# Patient Record
Sex: Female | Born: 1970 | Race: Black or African American | Hispanic: No | Marital: Single | Smoking: Never smoker
Health system: Southern US, Community
[De-identification: ages and names within clinical notes are randomized; demographics above are authoritative.]

## PROBLEM LIST (undated history)

## (undated) DIAGNOSIS — D649 Anemia, unspecified: Secondary | ICD-10-CM

## (undated) DIAGNOSIS — Z87898 Personal history of other specified conditions: Secondary | ICD-10-CM

## (undated) DIAGNOSIS — I10 Essential (primary) hypertension: Secondary | ICD-10-CM

## (undated) DIAGNOSIS — J45909 Unspecified asthma, uncomplicated: Secondary | ICD-10-CM

## (undated) DIAGNOSIS — M5126 Other intervertebral disc displacement, lumbar region: Secondary | ICD-10-CM

## (undated) DIAGNOSIS — E611 Iron deficiency: Secondary | ICD-10-CM

## (undated) DIAGNOSIS — J302 Other seasonal allergic rhinitis: Secondary | ICD-10-CM

## (undated) DIAGNOSIS — S0300XA Dislocation of jaw, unspecified side, initial encounter: Secondary | ICD-10-CM

## (undated) DIAGNOSIS — Z973 Presence of spectacles and contact lenses: Secondary | ICD-10-CM

## (undated) DIAGNOSIS — B373 Candidiasis of vulva and vagina: Secondary | ICD-10-CM

## (undated) DIAGNOSIS — D259 Leiomyoma of uterus, unspecified: Secondary | ICD-10-CM

## (undated) DIAGNOSIS — N201 Calculus of ureter: Secondary | ICD-10-CM

## (undated) DIAGNOSIS — I059 Rheumatic mitral valve disease, unspecified: Secondary | ICD-10-CM

## (undated) DIAGNOSIS — D1720 Benign lipomatous neoplasm of skin and subcutaneous tissue of unspecified limb: Secondary | ICD-10-CM

## (undated) DIAGNOSIS — Z862 Personal history of diseases of the blood and blood-forming organs and certain disorders involving the immune mechanism: Secondary | ICD-10-CM

## (undated) DIAGNOSIS — N6019 Diffuse cystic mastopathy of unspecified breast: Secondary | ICD-10-CM

## (undated) DIAGNOSIS — M5430 Sciatica, unspecified side: Secondary | ICD-10-CM

## (undated) DIAGNOSIS — N200 Calculus of kidney: Secondary | ICD-10-CM

## (undated) HISTORY — PX: WISDOM TOOTH EXTRACTION: SHX21

## (undated) HISTORY — DX: Benign lipomatous neoplasm of skin and subcutaneous tissue of unspecified limb: D17.20

## (undated) HISTORY — DX: Dislocation of jaw, unspecified side, initial encounter: S03.00XA

## (undated) HISTORY — DX: Candidiasis of vulva and vagina: B37.3

## (undated) HISTORY — DX: Iron deficiency: E61.1

## (undated) HISTORY — DX: Sciatica, unspecified side: M54.30

## (undated) HISTORY — DX: Anemia, unspecified: D64.9

## (undated) HISTORY — DX: Diffuse cystic mastopathy of unspecified breast: N60.19

## (undated) HISTORY — DX: Other seasonal allergic rhinitis: J30.2

---

## 2001-08-15 ENCOUNTER — Emergency Department (HOSPITAL_COMMUNITY): Admission: EM | Admit: 2001-08-15 | Discharge: 2001-08-16 | Payer: Self-pay | Admitting: Emergency Medicine

## 2001-08-16 ENCOUNTER — Encounter: Payer: Self-pay | Admitting: Emergency Medicine

## 2006-11-01 ENCOUNTER — Other Ambulatory Visit: Admission: RE | Admit: 2006-11-01 | Discharge: 2006-11-01 | Payer: Self-pay | Admitting: Obstetrics and Gynecology

## 2007-04-14 ENCOUNTER — Ambulatory Visit (HOSPITAL_COMMUNITY)
Admission: RE | Admit: 2007-04-14 | Discharge: 2007-04-14 | Payer: Self-pay | Admitting: Physical Medicine and Rehabilitation

## 2007-10-17 ENCOUNTER — Encounter: Admission: RE | Admit: 2007-10-17 | Discharge: 2007-10-17 | Payer: Self-pay | Admitting: Internal Medicine

## 2009-02-19 ENCOUNTER — Encounter: Admission: RE | Admit: 2009-02-19 | Discharge: 2009-02-19 | Payer: Self-pay | Admitting: Internal Medicine

## 2009-03-27 ENCOUNTER — Other Ambulatory Visit: Admission: RE | Admit: 2009-03-27 | Discharge: 2009-03-27 | Payer: Self-pay | Admitting: Obstetrics and Gynecology

## 2009-10-05 HISTORY — PX: MYOMECTOMY: SHX85

## 2009-10-05 HISTORY — PX: LAPAROSCOPIC GELPORT ASSISTED MYOMECTOMY: SHX6549

## 2010-04-10 ENCOUNTER — Other Ambulatory Visit: Admission: RE | Admit: 2010-04-10 | Discharge: 2010-04-10 | Payer: Self-pay | Admitting: Obstetrics and Gynecology

## 2013-03-01 ENCOUNTER — Other Ambulatory Visit: Payer: Self-pay | Admitting: Internal Medicine

## 2013-03-01 DIAGNOSIS — Z1231 Encounter for screening mammogram for malignant neoplasm of breast: Secondary | ICD-10-CM

## 2013-03-22 ENCOUNTER — Other Ambulatory Visit (HOSPITAL_COMMUNITY)
Admission: RE | Admit: 2013-03-22 | Discharge: 2013-03-22 | Disposition: A | Discharge status: Home / Self Care | Payer: BC Managed Care – PPO | Source: Ambulatory Visit | Attending: Obstetrics and Gynecology | Admitting: Obstetrics and Gynecology

## 2013-03-22 ENCOUNTER — Other Ambulatory Visit: Payer: Self-pay | Admitting: Obstetrics and Gynecology

## 2013-03-22 DIAGNOSIS — Z1151 Encounter for screening for human papillomavirus (HPV): Secondary | ICD-10-CM | POA: Insufficient documentation

## 2013-03-22 DIAGNOSIS — Z01419 Encounter for gynecological examination (general) (routine) without abnormal findings: Secondary | ICD-10-CM | POA: Insufficient documentation

## 2013-03-30 ENCOUNTER — Ambulatory Visit
Admission: RE | Admit: 2013-03-30 | Discharge: 2013-03-30 | Disposition: A | Discharge status: Home / Self Care | Payer: BC Managed Care – PPO | Source: Ambulatory Visit | Attending: Internal Medicine | Admitting: Internal Medicine

## 2013-03-30 DIAGNOSIS — Z1231 Encounter for screening mammogram for malignant neoplasm of breast: Secondary | ICD-10-CM

## 2013-05-05 ENCOUNTER — Encounter (INDEPENDENT_AMBULATORY_CARE_PROVIDER_SITE_OTHER): Payer: Self-pay

## 2013-05-09 ENCOUNTER — Ambulatory Visit (INDEPENDENT_AMBULATORY_CARE_PROVIDER_SITE_OTHER): Payer: BC Managed Care – PPO | Admitting: General Surgery

## 2013-05-09 ENCOUNTER — Encounter (INDEPENDENT_AMBULATORY_CARE_PROVIDER_SITE_OTHER): Payer: Self-pay | Admitting: General Surgery

## 2013-05-09 DIAGNOSIS — D1720 Benign lipomatous neoplasm of skin and subcutaneous tissue of unspecified limb: Secondary | ICD-10-CM

## 2013-05-09 DIAGNOSIS — D1739 Benign lipomatous neoplasm of skin and subcutaneous tissue of other sites: Secondary | ICD-10-CM

## 2013-05-09 NOTE — Patient Instructions (Addendum)
Please call when you would like to schedule your surgery, [phone]

## 2013-05-09 NOTE — Progress Notes (Signed)
Patient ID: Jasmine Monroe, female   DOB: 02-01-1971, 42 y.o.   MRN: [id number]  No chief complaint on file.   HPI Jasmine Monroe is a 42 y.o. female.   HPI  She is referred by Dr. Gerald Leitz for further evaluation of an enlarging left axillary soft tissue mass. She has had this for some time but its becoming larger.  She denies any pain from this area.  Past Medical History  Diagnosis Date  . Allergic rhinitis, seasonal   . TMJ (dislocation of temporomandibular joint)   . Fibrocystic breast   . Sciatica   . Anemia   . Iron deficiency   . Vaginal yeast infection   . Lipoma of axilla     left    Past Surgical History  Procedure Laterality Date  . Myomectomy  2011    uterine fibroid  . Wisdom tooth extraction      Family History  Problem Relation Age of Onset  . Hyperlipidemia Mother   . Cancer Father     prostate cancer  . Hypertension Sister     Social History History  Substance Use Topics  . Smoking status: Not on file  . Smokeless tobacco: Not on file  . Alcohol Use: Not on file    Not on File  Current Outpatient Prescriptions  Medication Sig Dispense Refill  . calcium-vitamin D (CALCIUM 500+D HIGH POTENCY) 500-400 MG-UNIT per tablet Take 1 tablet by mouth daily.      . cetirizine (ZYRTEC) 10 MG chewable tablet Chew 10 mg by mouth daily.      . fluticasone (FLONASE) 50 MCG/ACT nasal spray Place 2 sprays into the nose daily.      Marland Kitchen ibuprofen (ADVIL,MOTRIN) 800 MG tablet Take 800 mg by mouth every 8 (eight) hours as needed for pain.      . Magnesium 250 MG TABS Take 250 mg by mouth daily.       No current facility-administered medications for this visit.    Review of Systems Review of Systems  Constitutional: Negative.   Respiratory: Negative.   Cardiovascular: Negative.     There were no vitals taken for this visit.  Physical Exam Physical Exam  Constitutional: She appears well-developed and well-nourished. No distress.  Musculoskeletal:  6 cm soft  tissue mass lateral left axilla that is mobile and nontender. No erythema. Nothing like this on the right side.    Data Reviewed Dr. Dawayne Patricia note.  Assessment    Enlarging subcutaneous soft tissue mass left axilla-most likely lipoma but other undefined neoplasm cannot be ruled out.     Plan    Excision of left axillary soft tissue mass under general anesthesia.  The procedure, risks, and rationale were discussed with her. Risks include but not limited to bleeding, infection, wound healing problems, anesthesia, nerve damage and weakness. We also talked about potential for cosmetic deformity. She seems to understand all this and would like to proceed later early next year. She will call back to schedule the operation.       Alphonza Tramell J 05/09/2013, 1:54 PM

## 2013-11-01 ENCOUNTER — Ambulatory Visit (INDEPENDENT_AMBULATORY_CARE_PROVIDER_SITE_OTHER): Payer: BC Managed Care – PPO | Admitting: General Surgery

## 2013-11-01 ENCOUNTER — Encounter (INDEPENDENT_AMBULATORY_CARE_PROVIDER_SITE_OTHER): Payer: Self-pay | Admitting: General Surgery

## 2013-11-01 ENCOUNTER — Encounter (INDEPENDENT_AMBULATORY_CARE_PROVIDER_SITE_OTHER): Payer: Self-pay

## 2013-11-01 VITALS — BP 112/80 | HR 80 | Temp 98.7°F | Resp 14 | Ht 62.0 in | Wt 126.8 lb

## 2013-11-01 DIAGNOSIS — D1739 Benign lipomatous neoplasm of skin and subcutaneous tissue of other sites: Secondary | ICD-10-CM

## 2013-11-01 NOTE — Progress Notes (Signed)
Subjective:     Patient ID: Jasmine Monroe, female   DOB: Nov 27, 1970, 43 y.o.   MRN: [id number]  HPI  She is here to schedule surgery. I saw her in August of 2014. She had an enlarging left axillary mass and was interested in having that removed after the holidays. She is not sure that it has gotten any larger.   Review of Systems     Objective:   Physical Exam Gen.-she looks well and is in no acute distress.  Musculoskeletal-9 cm mass left posterior axillary line    Assessment:     Enlarging left axillary mass     Plan:     Removal of left axillary mass. We discussed the procedure risks and aftercare again. She seems understand all this and would like to proceed.

## 2013-11-01 NOTE — Patient Instructions (Signed)
We will schedule a surgery date today.

## 2013-12-03 HISTORY — PX: LIPOMA EXCISION: SHX5283

## 2013-12-07 ENCOUNTER — Other Ambulatory Visit (INDEPENDENT_AMBULATORY_CARE_PROVIDER_SITE_OTHER): Payer: Self-pay | Admitting: *Deleted

## 2013-12-07 ENCOUNTER — Other Ambulatory Visit (INDEPENDENT_AMBULATORY_CARE_PROVIDER_SITE_OTHER): Payer: Self-pay | Admitting: General Surgery

## 2013-12-07 DIAGNOSIS — D1739 Benign lipomatous neoplasm of skin and subcutaneous tissue of other sites: Secondary | ICD-10-CM

## 2013-12-07 MED ORDER — HYDROCODONE-ACETAMINOPHEN 5-325 MG PO TABS: 1.0000 | ORAL_TABLET | ORAL | Status: DC | PRN

## 2013-12-12 ENCOUNTER — Telehealth (INDEPENDENT_AMBULATORY_CARE_PROVIDER_SITE_OTHER): Payer: Self-pay

## 2013-12-12 NOTE — Telephone Encounter (Signed)
Notified pt her pathology shows a benign lipoma.

## 2013-12-25 ENCOUNTER — Ambulatory Visit (INDEPENDENT_AMBULATORY_CARE_PROVIDER_SITE_OTHER): Payer: BC Managed Care – PPO | Admitting: General Surgery

## 2013-12-25 ENCOUNTER — Encounter (INDEPENDENT_AMBULATORY_CARE_PROVIDER_SITE_OTHER): Payer: Self-pay | Admitting: General Surgery

## 2013-12-25 VITALS — BP 110/80 | HR 64 | Resp 14 | Ht 62.5 in | Wt 127.6 lb

## 2013-12-25 DIAGNOSIS — Z4889 Encounter for other specified surgical aftercare: Secondary | ICD-10-CM

## 2013-12-25 NOTE — Progress Notes (Signed)
She is here for a postoperative visit after removal of a soft tissue mass in the left axilla 12/07/2013. She is doing well and has no complaints. Pathology is consistent with a benign lipoma  On exam, the left axillary incision is clean and intact with minimal swelling.  Assessment: Wound is healing well and pathology is benign.  Plan: Activities as tolerated. Followup when necessary.

## 2013-12-25 NOTE — Patient Instructions (Signed)
Activities as tolerated with left arm. Call if you have problems with the incision.

## 2013-12-26 ENCOUNTER — Encounter (INDEPENDENT_AMBULATORY_CARE_PROVIDER_SITE_OTHER): Payer: Self-pay | Admitting: General Surgery

## 2014-03-23 ENCOUNTER — Other Ambulatory Visit: Payer: Self-pay

## 2014-03-23 DIAGNOSIS — Z1231 Encounter for screening mammogram for malignant neoplasm of breast: Secondary | ICD-10-CM

## 2014-03-28 ENCOUNTER — Other Ambulatory Visit: Payer: Self-pay | Admitting: Obstetrics and Gynecology

## 2014-03-28 ENCOUNTER — Other Ambulatory Visit (HOSPITAL_COMMUNITY)
Admission: RE | Admit: 2014-03-28 | Discharge: 2014-03-28 | Disposition: A | Discharge status: Home / Self Care | Payer: BC Managed Care – PPO | Source: Ambulatory Visit | Attending: Obstetrics and Gynecology | Admitting: Obstetrics and Gynecology

## 2014-03-28 DIAGNOSIS — Z01419 Encounter for gynecological examination (general) (routine) without abnormal findings: Secondary | ICD-10-CM | POA: Insufficient documentation

## 2014-03-30 LAB — CYTOLOGY - PAP

## 2014-04-02 ENCOUNTER — Ambulatory Visit
Admission: RE | Admit: 2014-04-02 | Discharge: 2014-04-02 | Disposition: A | Discharge status: Home / Self Care | Payer: BC Managed Care – PPO | Source: Ambulatory Visit

## 2014-04-02 DIAGNOSIS — Z1231 Encounter for screening mammogram for malignant neoplasm of breast: Secondary | ICD-10-CM

## 2015-03-29 ENCOUNTER — Other Ambulatory Visit: Payer: Self-pay

## 2015-03-29 DIAGNOSIS — Z1231 Encounter for screening mammogram for malignant neoplasm of breast: Secondary | ICD-10-CM

## 2015-04-22 ENCOUNTER — Ambulatory Visit: Payer: BC Managed Care – PPO

## 2015-04-22 ENCOUNTER — Ambulatory Visit
Admission: RE | Admit: 2015-04-22 | Discharge: 2015-04-22 | Disposition: A | Discharge status: Home / Self Care | Payer: BC Managed Care – PPO | Source: Ambulatory Visit

## 2015-04-22 DIAGNOSIS — Z1231 Encounter for screening mammogram for malignant neoplasm of breast: Secondary | ICD-10-CM

## 2016-04-10 ENCOUNTER — Other Ambulatory Visit: Payer: Self-pay | Admitting: Internal Medicine

## 2016-04-10 DIAGNOSIS — Z1231 Encounter for screening mammogram for malignant neoplasm of breast: Secondary | ICD-10-CM

## 2016-04-22 ENCOUNTER — Other Ambulatory Visit (HOSPITAL_COMMUNITY)
Admission: RE | Admit: 2016-04-22 | Discharge: 2016-04-22 | Disposition: A | Discharge status: Home / Self Care | Payer: BC Managed Care – PPO | Source: Ambulatory Visit | Attending: Obstetrics and Gynecology | Admitting: Obstetrics and Gynecology

## 2016-04-22 ENCOUNTER — Other Ambulatory Visit: Payer: Self-pay | Admitting: Obstetrics and Gynecology

## 2016-04-22 DIAGNOSIS — Z1151 Encounter for screening for human papillomavirus (HPV): Secondary | ICD-10-CM | POA: Insufficient documentation

## 2016-04-22 DIAGNOSIS — Z01419 Encounter for gynecological examination (general) (routine) without abnormal findings: Secondary | ICD-10-CM | POA: Insufficient documentation

## 2016-04-23 LAB — CYTOLOGY - PAP

## 2016-04-27 ENCOUNTER — Ambulatory Visit
Admission: RE | Admit: 2016-04-27 | Discharge: 2016-04-27 | Disposition: A | Discharge status: Home / Self Care | Payer: BC Managed Care – PPO | Source: Ambulatory Visit | Attending: Internal Medicine | Admitting: Internal Medicine

## 2016-04-27 DIAGNOSIS — Z1231 Encounter for screening mammogram for malignant neoplasm of breast: Secondary | ICD-10-CM

## 2016-11-28 ENCOUNTER — Encounter (HOSPITAL_BASED_OUTPATIENT_CLINIC_OR_DEPARTMENT_OTHER): Payer: Self-pay | Admitting: Respiratory Therapy

## 2016-11-28 ENCOUNTER — Emergency Department (HOSPITAL_BASED_OUTPATIENT_CLINIC_OR_DEPARTMENT_OTHER)
Admission: EM | Admit: 2016-11-28 | Discharge: 2016-11-29 | Disposition: A | Discharge status: Home / Self Care | Payer: BC Managed Care – PPO | Attending: Emergency Medicine | Admitting: Emergency Medicine

## 2016-11-28 DIAGNOSIS — Z79899 Other long term (current) drug therapy: Secondary | ICD-10-CM | POA: Insufficient documentation

## 2016-11-28 DIAGNOSIS — I493 Ventricular premature depolarization: Secondary | ICD-10-CM | POA: Insufficient documentation

## 2016-11-28 DIAGNOSIS — R002 Palpitations: Secondary | ICD-10-CM | POA: Diagnosis present

## 2016-11-28 DIAGNOSIS — R55 Syncope and collapse: Secondary | ICD-10-CM | POA: Diagnosis not present

## 2016-11-28 HISTORY — DX: Unspecified asthma, uncomplicated: J45.909

## 2016-11-28 HISTORY — DX: Rheumatic mitral valve disease, unspecified: I05.9

## 2016-11-28 LAB — URINALYSIS, ROUTINE W REFLEX MICROSCOPIC
Bilirubin Urine: NEGATIVE
Glucose, UA: NEGATIVE mg/dL
Hgb urine dipstick: NEGATIVE
Ketones, ur: NEGATIVE mg/dL
Nitrite: NEGATIVE
Protein, ur: NEGATIVE mg/dL
Specific Gravity, Urine: 1.021 (ref 1.005–1.030)
pH: 7.5 (ref 5.0–8.0)

## 2016-11-28 LAB — BASIC METABOLIC PANEL
Anion gap: 8 (ref 5–15)
BUN: 21 mg/dL — ABNORMAL HIGH (ref 6–20)
CO2: 27 mmol/L (ref 22–32)
Calcium: 9.8 mg/dL (ref 8.9–10.3)
Chloride: 102 mmol/L (ref 101–111)
Creatinine, Ser: 0.75 mg/dL (ref 0.44–1.00)
GFR calc Af Amer: >60 mL/min (ref >60)
GFR calc non Af Amer: >60 mL/min (ref >60)
Glucose, Bld: 100 mg/dL — ABNORMAL HIGH (ref 65–99)
Potassium: 3.4 mmol/L — ABNORMAL LOW (ref 3.5–5.1)
Sodium: 137 mmol/L (ref 135–145)

## 2016-11-28 LAB — CBC
HCT: 36.1 % (ref 36.0–46.0)
Hemoglobin: 12.5 g/dL (ref 12.0–15.0)
MCH: 27.8 pg (ref 26.0–34.0)
MCHC: 34.6 g/dL (ref 30.0–36.0)
MCV: 80.4 fL (ref 78.0–100.0)
Platelets: 280 10*3/uL (ref 150–400)
RBC: 4.49 MIL/uL (ref 3.87–5.11)
RDW: 14.5 % (ref 11.5–15.5)
WBC: 7 10*3/uL (ref 4.0–10.5)

## 2016-11-28 LAB — URINALYSIS, MICROSCOPIC (REFLEX)

## 2016-11-28 NOTE — ED Provider Notes (Signed)
[facility] Provider Note   CSN: [id number] Arrival date & time: 11/28/16  1930  By signing my name below, I, Jasmine Monroe, attest that this documentation has been prepared under the direction and in the presence of Jasmine Morgan, MD. Electronically Signed: Jeanell Monroe, Scribe. 11/28/2016. 10:24 PM.  History   Chief Complaint Chief Complaint  Patient presents with  . Irregular Heart Beat   The history is provided by the patient and a relative. No language interpreter was used.   HPI Comments: Jasmine Monroe is a 46 y.o. female who presents to the Emergency Department complaining of intermittent heart palpitations that started 2 days ago. She states she passed out briefly last night while using the bathroom. Reports she got up to use the bathroom from sleep and does not recall if she felt lightheaded prior to incident, but had syncope and woke up on the floor.  She hit the back of her head but does not have headache, no n/v. No other neck pain. Chronic back pain relatively unchanged.   She describes her heartbeat during episodes of palpitations as irregular. No current palpitations but has had some in the ED.  She had similar symptoms prior and wore holter monitor and reports her PCP diagnosed her with MVP based on this. She denies any hx of syncope, family hx of sudden cardiac death, fever, chills, chest pain, SOB, tongue biting, bowel/bladder incontinence, nausea, vomiting, diarrhea, constipation, abdominal pain, dysuria, new weakness/numbness, blood in stool, headache, neck pain, or lightheadedness. Jasmine Monroe reports she is under a lot of stress.     PCP: Jasmine Low, MD  Past Medical History:  Diagnosis Date  . Allergic rhinitis, seasonal   . Anemia   . Asthma   . Fibrocystic breast   . Iron deficiency   . Lipoma of axilla    left  . Mitral valve disorders(424.0)    mitral valve prolapse   . Sciatica   . TMJ (dislocation of temporomandibular joint)   . Vaginal  yeast infection     Patient Active Problem List   Diagnosis Date Noted  . Lipoma of axilla-left 05/09/2013    Past Surgical History:  Procedure Laterality Date  . MYOMECTOMY  2011   uterine fibroid  . WISDOM TOOTH EXTRACTION      OB History    No data available       Home Medications    Prior to Admission medications   Medication Sig Start Date End Date Taking? Authorizing Provider  calcium-vitamin D (CALCIUM 500+D HIGH POTENCY) 500-400 MG-UNIT per tablet Take 1 tablet by mouth daily.   Yes Historical Provider, MD  cetirizine (ZYRTEC) 10 MG chewable tablet Chew 10 mg by mouth daily.   Yes Historical Provider, MD  fluticasone (FLONASE) 50 MCG/ACT nasal spray Place 2 sprays into the nose daily.   Yes Historical Provider, MD  Fluticasone-Salmeterol (ADVAIR) 100-50 MCG/DOSE AEPB Inhale 1 puff into the lungs 2 (two) times daily as needed.   Yes Historical Provider, MD  ibuprofen (ADVIL,MOTRIN) 800 MG tablet Take 800 mg by mouth every 8 (eight) hours as needed for pain.   Yes Historical Provider, MD  budesonide-formoterol (SYMBICORT) 80-4.5 MCG/ACT inhaler Inhale 2 puffs into the lungs 2 (two) times daily.    Historical Provider, MD  Magnesium 250 MG TABS Take 250 mg by mouth daily.    Historical Provider, MD  omeprazole (PRILOSEC) 40 MG capsule  08/10/13   Historical Provider, MD    Family History Family History  Problem  Relation Age of Onset  . Hyperlipidemia Mother   . Cancer Father     prostate cancer  . Hypertension Sister     Social History Social History  Substance Use Topics  . Smoking status: Never Smoker  . Smokeless tobacco: Never Used  . Alcohol use No     Allergies   Penicillins   Review of Systems Review of Systems  Constitutional: Negative for chills and fever.  HENT: Negative for sore throat.   Eyes: Negative for visual disturbance.  Respiratory: Negative for cough and shortness of breath.   Cardiovascular: Positive for palpitations. Negative  for chest pain and leg swelling.  Gastrointestinal: Negative for abdominal pain, blood in stool, constipation, nausea and vomiting.  Genitourinary: Negative for difficulty urinating and dysuria.  Musculoskeletal: Negative for back pain and neck pain.  Skin: Negative for rash.  Neurological: Positive for syncope. Negative for weakness, numbness and headaches.     Physical Exam Updated Vital Signs BP 113/85 (BP Location: Left Arm)   Pulse 87   Temp 98.7 F (37.1 C) (Oral)   Resp 16   Ht 5\' 2"  (1.575 m)   Wt 133 lb (60.3 kg)   SpO2 100%   BMI 24.33 kg/m   Physical Exam  Constitutional: She is oriented to person, place, and time. She appears well-developed and well-nourished. No distress.  HENT:  Head: Normocephalic and atraumatic.  Eyes: Conjunctivae and EOM are normal.  Neck: Normal range of motion. Neck supple.  Cardiovascular: Normal rate, regular rhythm, normal heart sounds and intact distal pulses.  Exam reveals no gallop and no friction rub.   No murmur heard. Pulses:      Radial pulses are 2+ on the right side, and 2+ on the left side.       Posterior tibial pulses are 2+ on the right side, and 2+ on the left side.  Pulmonary/Chest: Effort normal and breath sounds normal. No respiratory distress. She has no wheezes. She has no rales.  Abdominal: Soft. She exhibits no distension. There is no tenderness. There is no guarding.  Musculoskeletal: Normal range of motion. She exhibits no edema or tenderness.  Neurological: She is alert and oriented to person, place, and time. No cranial nerve deficit or sensory deficit. GCS eye subscore is 4. GCS verbal subscore is 5. GCS motor subscore is 6.  Strength upper ext and lower ext hip flexion equa, normal  Skin: Skin is warm and dry. No rash noted. She is not diaphoretic. No erythema.  Psychiatric: She has a normal mood and affect.  Nursing note and vitals reviewed.    ED Treatments / Results  DIAGNOSTIC STUDIES: Oxygen  Saturation is 100% on RA, normal by my interpretation.    COORDINATION OF CARE: 10:28 PM- Pt advised of plan for treatment and pt agrees.  Labs (all labs ordered are listed, but only abnormal results are displayed) Labs Reviewed  BASIC METABOLIC PANEL - Abnormal; Notable for the following:       Result Value   Potassium 3.4 (*)    Glucose, Bld 100 (*)    BUN 21 (*)    All other components within normal limits  URINALYSIS, ROUTINE W REFLEX MICROSCOPIC - Abnormal; Notable for the following:    APPearance CLOUDY (*)    Leukocytes, UA MODERATE (*)    All other components within normal limits  URINALYSIS, MICROSCOPIC (REFLEX) - Abnormal; Notable for the following:    Bacteria, UA MANY (*)    Squamous Epithelial / LPF  6-30 (*)    All other components within normal limits  CBC    EKG  EKG Interpretation  Date/Time:  Saturday November 28 2016 19:39:11 EST Ventricular Rate:  88 PR Interval:  136 QRS Duration: 80 QT Interval:  390 QTC Calculation: 471 R Axis:   69 Text Interpretation:  Sinus rhythm with sinus arrhythmia with occasional Premature ventricular complexes Nonspecific ST and T wave abnormality Abnormal ECG Similar ST changes lateral and inferior leads as prior however more prominent today Confirmed by [facility] MD, Junie Panning ([id number]) on 11/28/2016 9:30:21 PM       Radiology No results found.  Procedures Procedures (including critical care time)  Medications Ordered in ED Medications  potassium chloride SA (K-DUR,KLOR-CON) CR tablet 40 mEq (40 mEq Oral Given 11/29/16 0054)     Initial Impression / Assessment and Plan / ED Course  I have reviewed the triage vital signs and the nursing notes.  Pertinent labs & imaging results that were available during my care of the patient were reviewed by me and considered in my medical decision making (see chart for details).     46yo female with history of asthma, MVP presents with concern for palpitations and syncopal episode  last night.  Reports she is menopausal, abstinent, doubt ectopic. EKG with PVC, sinus rhythm with no sign of prolonged QTc, no brugada, no sign of HOCM, no delta waves, no ST abnormalities. No fam hx of sudden death. Normal hgb. No significant electrolyte abnormalities.  Palpitations in ED noted to be occasional PVCs without other signs of arrhythmia.  No CP or dyspnea to suggest PE or ACS. No hx to suggest dehydration, bleed, CVA or seizure.  Pt remains stable in ED.  Unclear by hx if orthostatic change when pt going from laying in bed to getting up to go to bathroom. This vs syncope related to MVP versus other cardiac arrhythmia on differential. Feel she is stable for outpatient follow up given situation of laying to standing that led to episode. Recommend outpatient follow up, ECHO, holter monitoring. Patient discharged in stable condition with understanding of reasons to return.   Final Clinical Impressions(s) / ED Diagnoses   Final diagnoses:  Syncope, unspecified syncope type  PVC (premature ventricular contraction)    New Prescriptions Discharge Medication List as of 11/29/2016 12:21 AM     I personally performed the services described in this documentation, which was scribed in my presence. The recorded information has been reviewed and is accurate.     Jasmine Morgan, MD 11/29/16 [phone]

## 2016-11-28 NOTE — ED Triage Notes (Signed)
Friday had a syncopal episode for a few seconds before having a diarrhea stool. Has chronic back pain and hit head with fall, woke up on back. Also feeling like heart is pounding in chest and cramps to legs and weak . Has ate per norm today and denies abd pain at prsent.

## 2016-11-29 MED ORDER — POTASSIUM CHLORIDE CRYS ER 20 MEQ PO TBCR
40.0000 meq | EXTENDED_RELEASE_TABLET | Freq: Once | ORAL | Status: AC
  Administered 2016-11-29: 40 meq via ORAL
  Filled 2016-11-29: qty 2

## 2016-12-04 ENCOUNTER — Other Ambulatory Visit: Payer: Self-pay | Admitting: Internal Medicine

## 2016-12-04 DIAGNOSIS — R55 Syncope and collapse: Secondary | ICD-10-CM

## 2016-12-17 ENCOUNTER — Other Ambulatory Visit: Payer: Self-pay

## 2016-12-17 ENCOUNTER — Ambulatory Visit (HOSPITAL_COMMUNITY): Payer: BC Managed Care – PPO | Attending: Cardiovascular Disease

## 2016-12-17 DIAGNOSIS — I340 Nonrheumatic mitral (valve) insufficiency: Secondary | ICD-10-CM | POA: Insufficient documentation

## 2016-12-17 DIAGNOSIS — I517 Cardiomegaly: Secondary | ICD-10-CM | POA: Diagnosis not present

## 2016-12-17 DIAGNOSIS — R55 Syncope and collapse: Secondary | ICD-10-CM

## 2016-12-17 DIAGNOSIS — I361 Nonrheumatic tricuspid (valve) insufficiency: Secondary | ICD-10-CM | POA: Diagnosis not present

## 2017-04-14 ENCOUNTER — Other Ambulatory Visit: Payer: Self-pay | Admitting: Internal Medicine

## 2017-04-14 DIAGNOSIS — Z1231 Encounter for screening mammogram for malignant neoplasm of breast: Secondary | ICD-10-CM

## 2017-05-03 ENCOUNTER — Encounter (INDEPENDENT_AMBULATORY_CARE_PROVIDER_SITE_OTHER): Payer: Self-pay

## 2017-05-03 ENCOUNTER — Ambulatory Visit
Admission: RE | Admit: 2017-05-03 | Discharge: 2017-05-03 | Disposition: A | Discharge status: Home / Self Care | Payer: BC Managed Care – PPO | Source: Ambulatory Visit | Attending: Internal Medicine | Admitting: Internal Medicine

## 2017-05-03 ENCOUNTER — Ambulatory Visit: Payer: BC Managed Care – PPO

## 2017-05-03 DIAGNOSIS — Z1231 Encounter for screening mammogram for malignant neoplasm of breast: Secondary | ICD-10-CM

## 2017-08-18 ENCOUNTER — Other Ambulatory Visit: Payer: Self-pay | Admitting: Internal Medicine

## 2017-08-18 DIAGNOSIS — R140 Abdominal distension (gaseous): Secondary | ICD-10-CM

## 2017-08-20 ENCOUNTER — Ambulatory Visit
Admission: RE | Admit: 2017-08-20 | Discharge: 2017-08-20 | Disposition: A | Discharge status: Home / Self Care | Payer: BC Managed Care – PPO | Source: Ambulatory Visit | Attending: Internal Medicine | Admitting: Internal Medicine

## 2017-08-20 ENCOUNTER — Other Ambulatory Visit: Payer: Self-pay | Admitting: Internal Medicine

## 2017-08-20 DIAGNOSIS — R140 Abdominal distension (gaseous): Secondary | ICD-10-CM

## 2018-04-19 ENCOUNTER — Other Ambulatory Visit: Payer: Self-pay | Admitting: Internal Medicine

## 2018-04-19 DIAGNOSIS — Z1231 Encounter for screening mammogram for malignant neoplasm of breast: Secondary | ICD-10-CM

## 2018-05-11 ENCOUNTER — Ambulatory Visit
Admission: RE | Admit: 2018-05-11 | Discharge: 2018-05-11 | Disposition: A | Discharge status: Home / Self Care | Payer: BC Managed Care – PPO | Source: Ambulatory Visit | Attending: Internal Medicine | Admitting: Internal Medicine

## 2018-05-11 DIAGNOSIS — Z1231 Encounter for screening mammogram for malignant neoplasm of breast: Secondary | ICD-10-CM

## 2018-05-13 ENCOUNTER — Ambulatory Visit: Payer: BC Managed Care – PPO

## 2018-06-01 ENCOUNTER — Other Ambulatory Visit: Payer: Self-pay | Admitting: Nurse Practitioner

## 2018-06-01 ENCOUNTER — Ambulatory Visit
Admission: RE | Admit: 2018-06-01 | Discharge: 2018-06-01 | Disposition: A | Discharge status: Home / Self Care | Payer: BC Managed Care – PPO | Source: Ambulatory Visit | Attending: Nurse Practitioner | Admitting: Nurse Practitioner

## 2018-06-01 DIAGNOSIS — A689 Relapsing fever, unspecified: Secondary | ICD-10-CM

## 2019-04-04 ENCOUNTER — Other Ambulatory Visit: Payer: Self-pay | Admitting: Orthopaedic Surgery

## 2019-04-04 DIAGNOSIS — M25561 Pain in right knee: Secondary | ICD-10-CM

## 2019-04-13 ENCOUNTER — Ambulatory Visit
Admission: RE | Admit: 2019-04-13 | Discharge: 2019-04-13 | Disposition: A | Discharge status: Home / Self Care | Payer: BC Managed Care – PPO | Source: Ambulatory Visit | Attending: Orthopaedic Surgery | Admitting: Orthopaedic Surgery

## 2019-04-13 ENCOUNTER — Other Ambulatory Visit: Payer: Self-pay

## 2019-04-13 DIAGNOSIS — M25561 Pain in right knee: Secondary | ICD-10-CM

## 2019-04-26 ENCOUNTER — Other Ambulatory Visit: Payer: BC Managed Care – PPO

## 2019-06-16 ENCOUNTER — Other Ambulatory Visit (HOSPITAL_COMMUNITY)
Admission: RE | Admit: 2019-06-16 | Discharge: 2019-06-16 | Disposition: A | Discharge status: Home / Self Care | Payer: BC Managed Care – PPO | Source: Ambulatory Visit | Attending: Obstetrics and Gynecology | Admitting: Obstetrics and Gynecology

## 2019-06-16 ENCOUNTER — Other Ambulatory Visit: Payer: Self-pay | Admitting: Obstetrics and Gynecology

## 2019-06-16 DIAGNOSIS — Z01419 Encounter for gynecological examination (general) (routine) without abnormal findings: Secondary | ICD-10-CM | POA: Diagnosis present

## 2019-06-19 ENCOUNTER — Other Ambulatory Visit: Payer: Self-pay | Admitting: Internal Medicine

## 2019-06-19 DIAGNOSIS — Z1231 Encounter for screening mammogram for malignant neoplasm of breast: Secondary | ICD-10-CM

## 2019-06-19 LAB — CYTOLOGY - PAP
Diagnosis: NEGATIVE
HPV: NOT DETECTED

## 2019-08-03 ENCOUNTER — Ambulatory Visit: Payer: BC Managed Care – PPO

## 2019-08-04 ENCOUNTER — Other Ambulatory Visit: Payer: Self-pay

## 2019-08-04 ENCOUNTER — Ambulatory Visit
Admission: RE | Admit: 2019-08-04 | Discharge: 2019-08-04 | Disposition: A | Discharge status: Home / Self Care | Payer: BC Managed Care – PPO | Source: Ambulatory Visit | Attending: Internal Medicine | Admitting: Internal Medicine

## 2019-08-04 DIAGNOSIS — Z1231 Encounter for screening mammogram for malignant neoplasm of breast: Secondary | ICD-10-CM

## 2019-08-08 IMAGING — CR DG CHEST 2V
2 series · 2 of 2 positions shown · non-contrast
Comparison: CT 10/17/2007.

CLINICAL DATA: Cough.  Fever.

EXAM:
CHEST - 2 VIEW

[w chest pa]
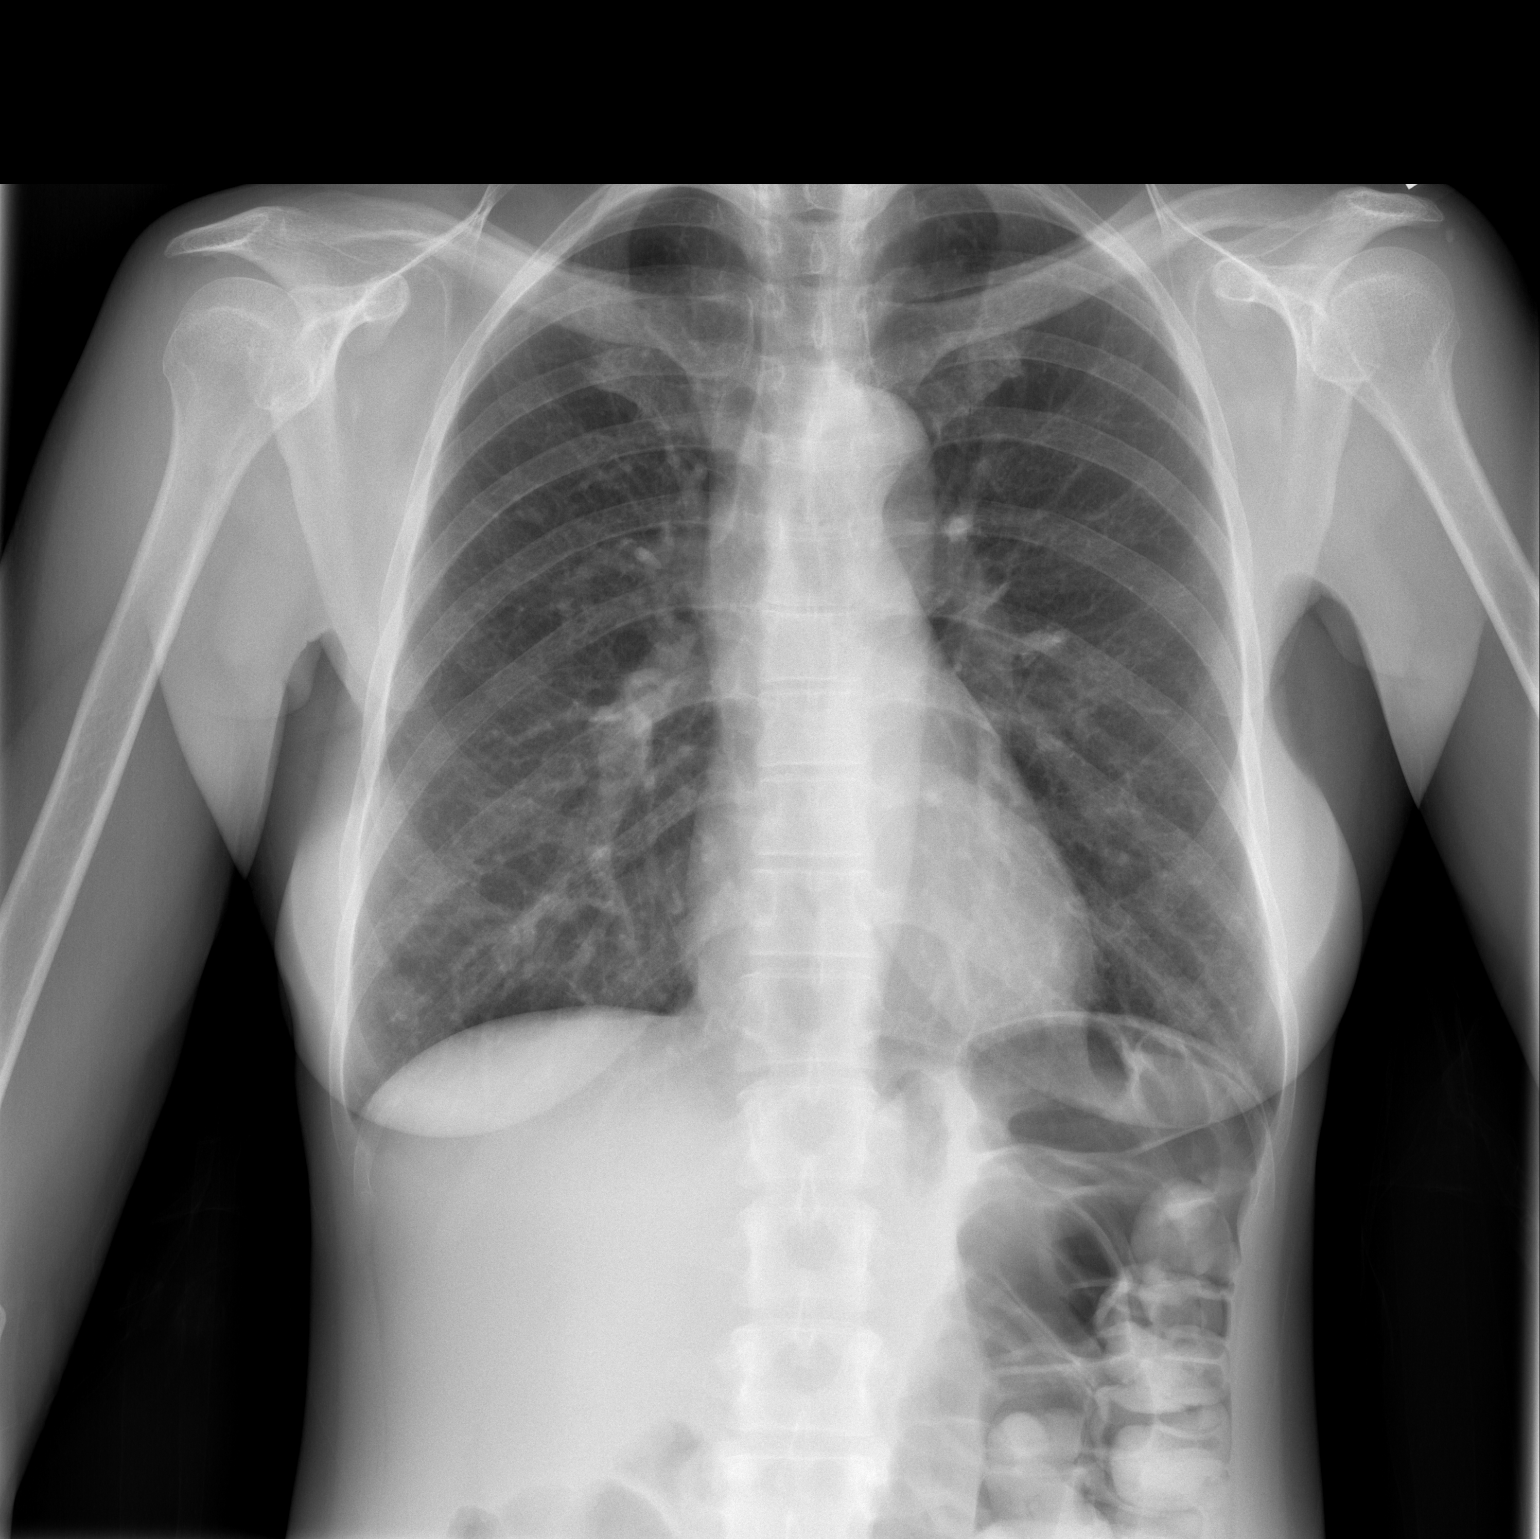

[w chest lat]
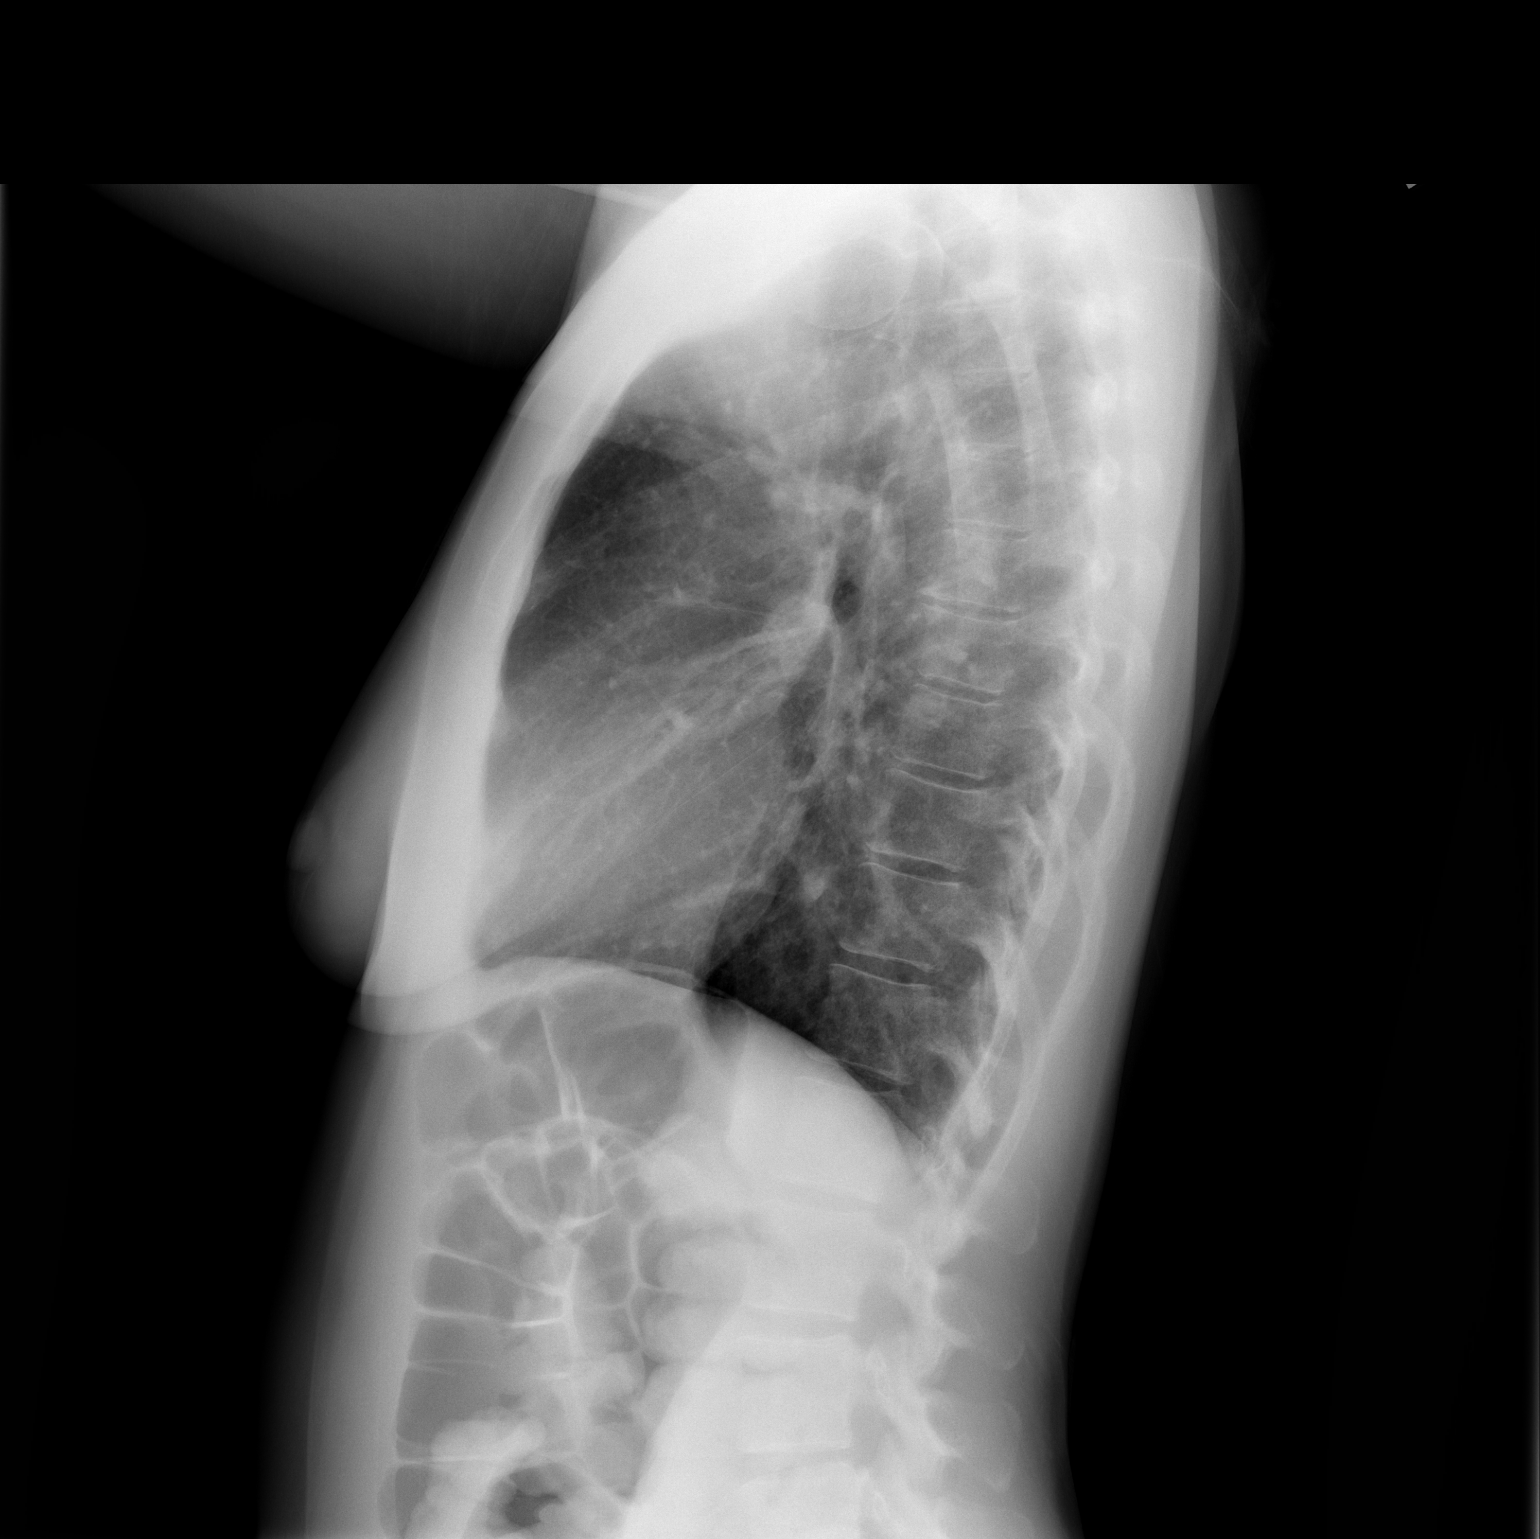

[2 of 2 positions shown; findings below may reference images not displayed]

FINDINGS: Mediastinum and hilar structures normal. Lungs are clear. No pleural
effusion or pneumothorax. No acute bony abnormality.
IMPRESSION: No acute cardiopulmonary disease.

## 2020-06-11 ENCOUNTER — Other Ambulatory Visit: Payer: Self-pay | Admitting: Internal Medicine

## 2020-06-11 DIAGNOSIS — Z1231 Encounter for screening mammogram for malignant neoplasm of breast: Secondary | ICD-10-CM

## 2020-08-06 ENCOUNTER — Ambulatory Visit
Admission: RE | Admit: 2020-08-06 | Discharge: 2020-08-06 | Disposition: A | Discharge status: Home / Self Care | Payer: BC Managed Care – PPO | Source: Ambulatory Visit | Attending: Internal Medicine | Admitting: Internal Medicine

## 2020-08-06 ENCOUNTER — Other Ambulatory Visit: Payer: Self-pay

## 2020-08-06 DIAGNOSIS — Z1231 Encounter for screening mammogram for malignant neoplasm of breast: Secondary | ICD-10-CM

## 2020-09-24 ENCOUNTER — Other Ambulatory Visit: Payer: Self-pay | Admitting: Internal Medicine

## 2020-09-24 DIAGNOSIS — R319 Hematuria, unspecified: Secondary | ICD-10-CM

## 2020-10-09 ENCOUNTER — Other Ambulatory Visit: Payer: Self-pay

## 2020-10-09 ENCOUNTER — Ambulatory Visit (HOSPITAL_COMMUNITY)
Admission: RE | Admit: 2020-10-09 | Discharge: 2020-10-09 | Disposition: A | Discharge status: Home / Self Care | Payer: BC Managed Care – PPO | Source: Ambulatory Visit | Attending: Internal Medicine | Admitting: Internal Medicine

## 2020-10-09 DIAGNOSIS — R319 Hematuria, unspecified: Secondary | ICD-10-CM | POA: Diagnosis not present

## 2020-10-10 IMAGING — MG DIGITAL SCREENING BILAT W/ CAD
4 series · 4 of 4 positions shown · non-contrast
Comparison: Previous exam(s).

CLINICAL DATA: Screening.

EXAM:
DIGITAL SCREENING BILATERAL MAMMOGRAM WITH CAD

[R CC]
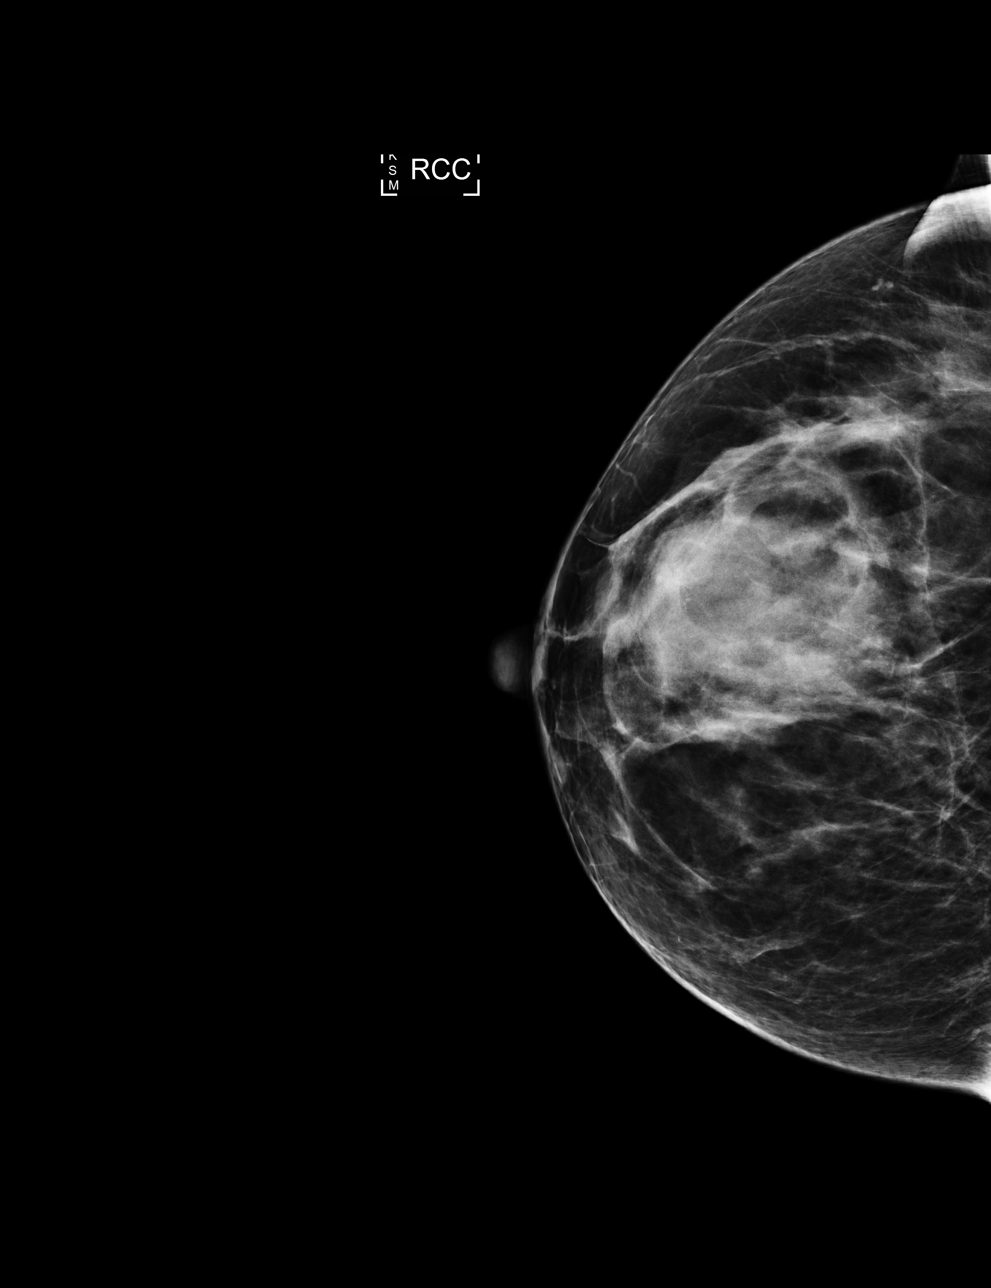

[L CC]
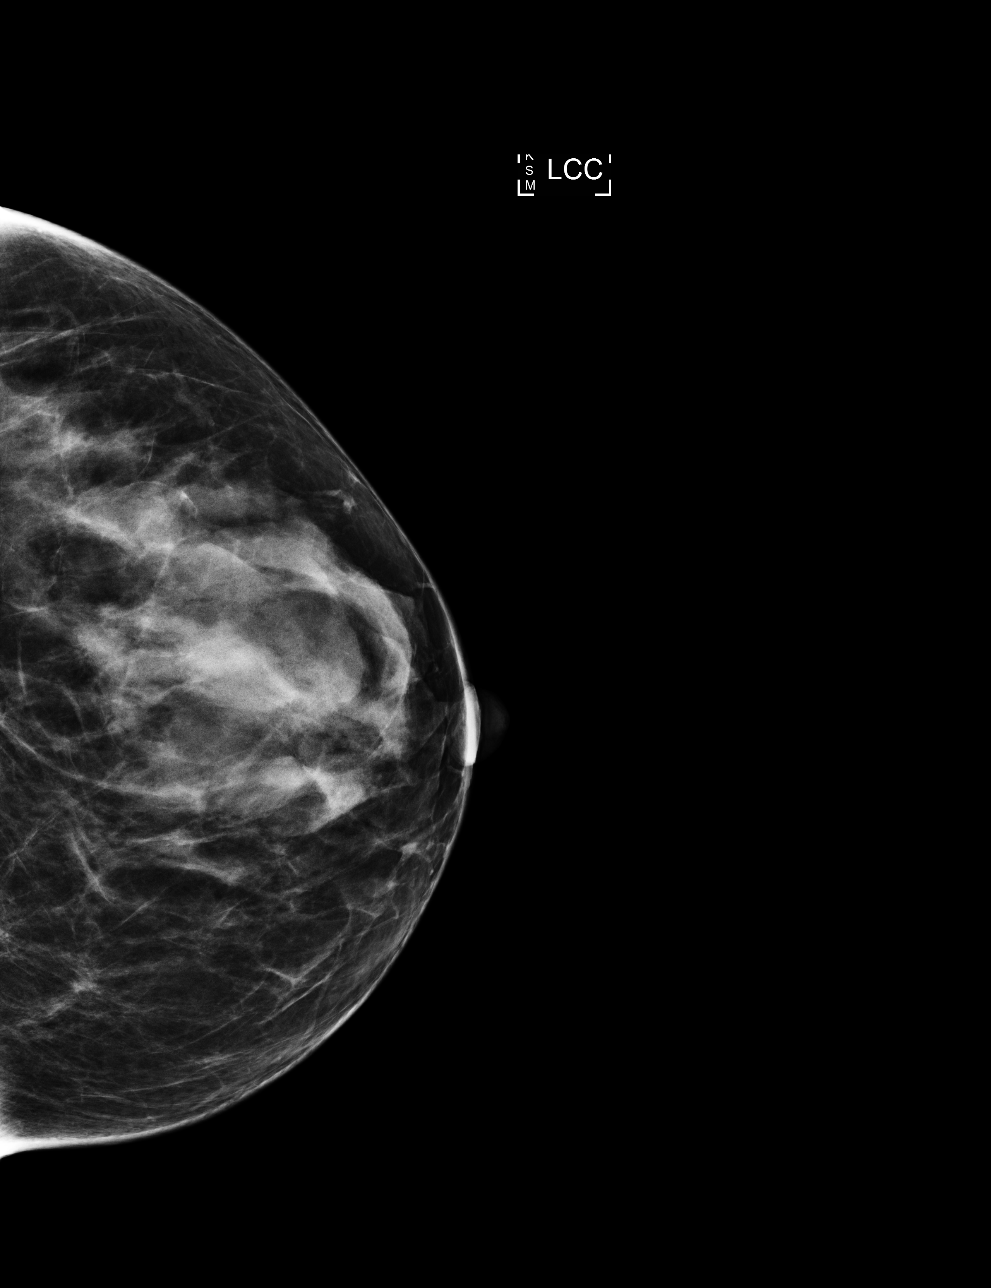

[L MLO]
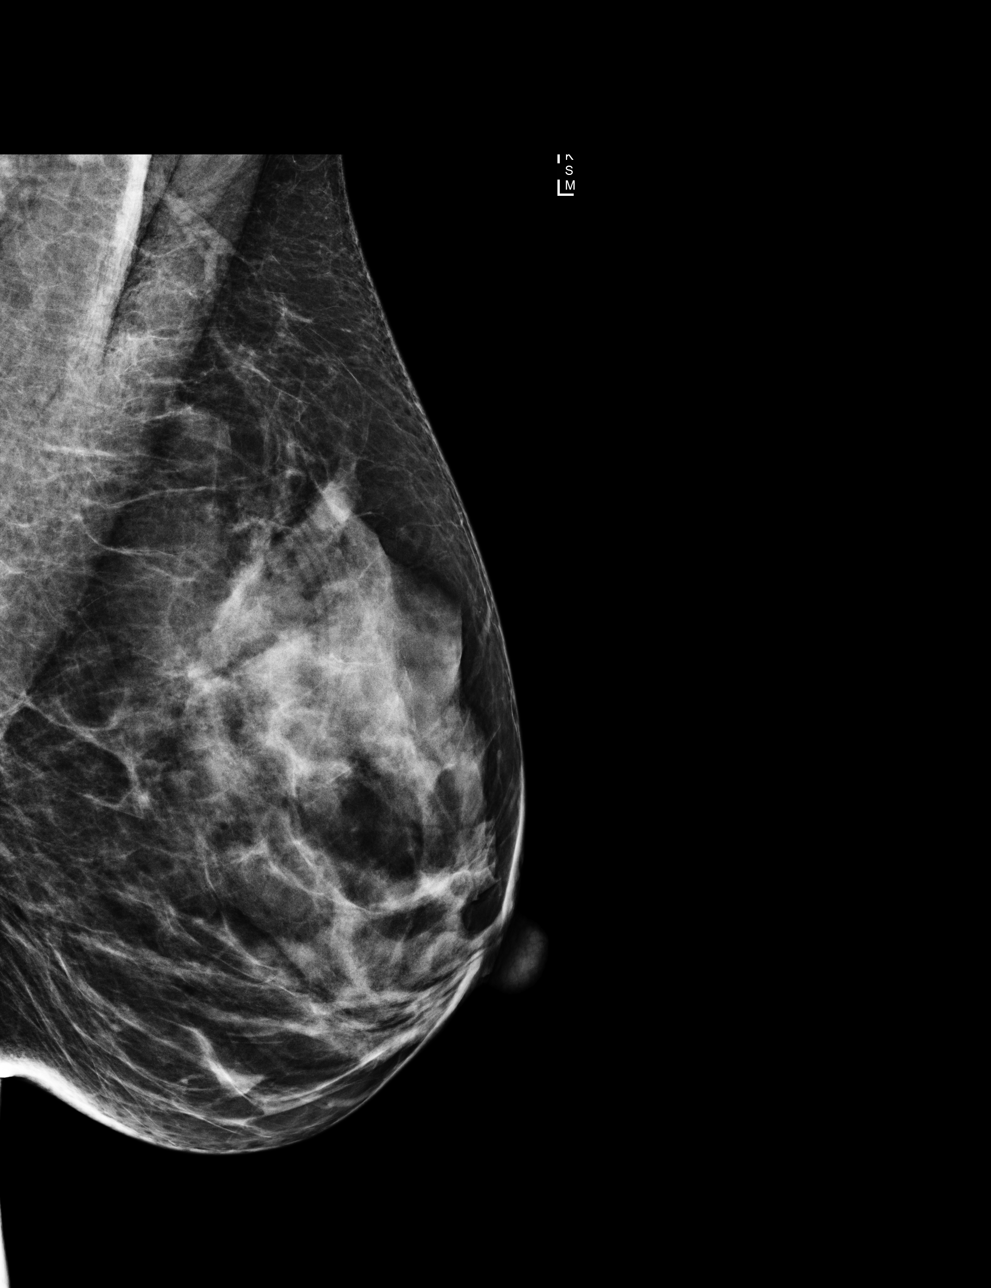

[R MLO]
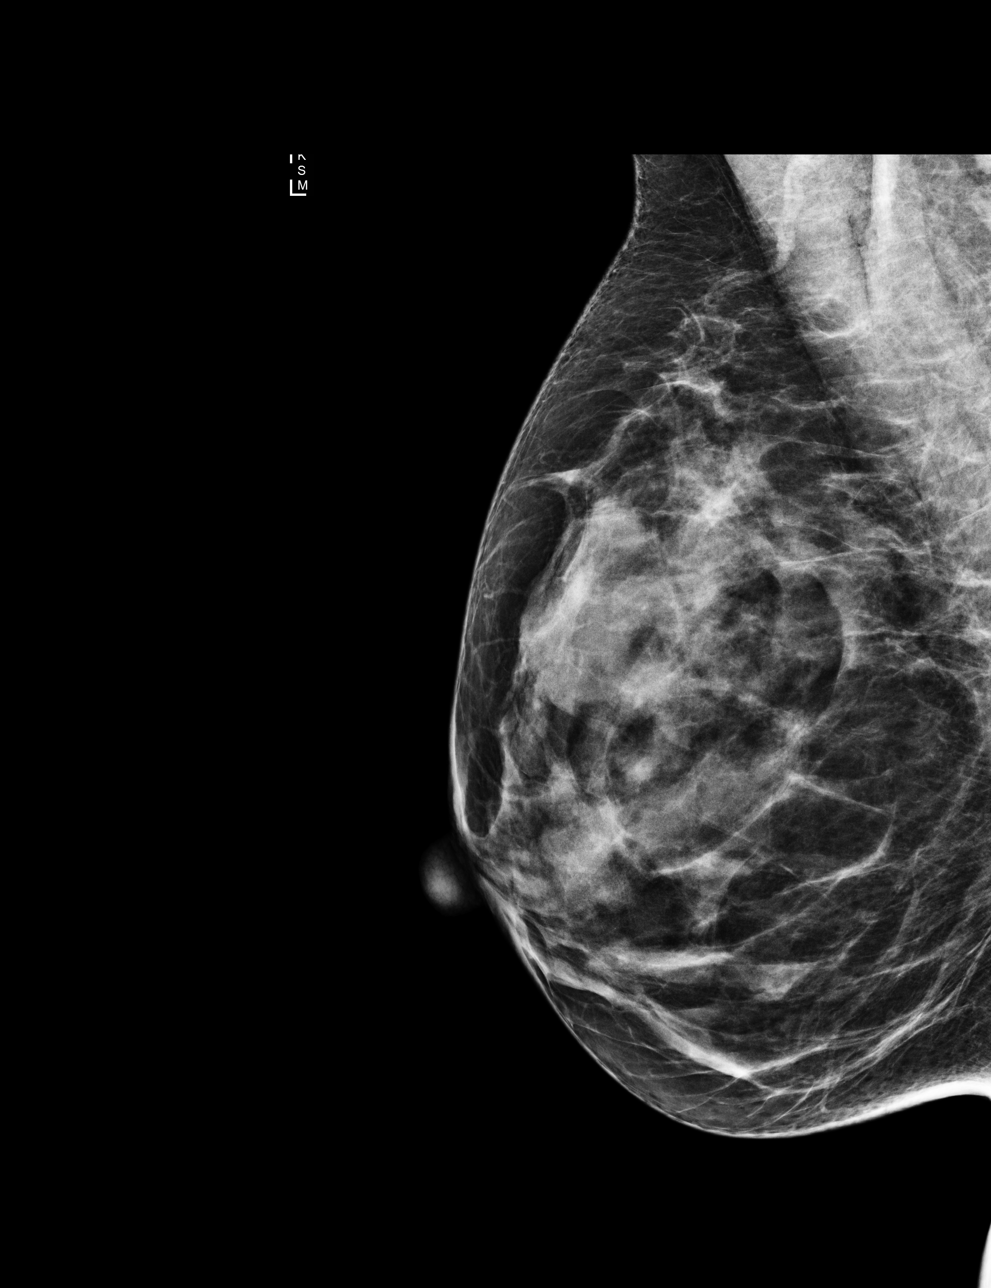

[4 of 4 positions shown; findings below may reference images not displayed]

ACR Breast Density Category c: The breast tissue is heterogeneously
dense, which may obscure small masses.
FINDINGS: There are no findings suspicious for malignancy. Images were
processed with CAD.
IMPRESSION: No mammographic evidence of malignancy. A result letter of this
screening mammogram will be mailed directly to the patient.

RECOMMENDATION:
Screening mammogram in one year. (Code:YJ-[id number])

BI-RADS CATEGORY  1: Negative.

## 2020-10-15 ENCOUNTER — Other Ambulatory Visit: Payer: BC Managed Care – PPO

## 2021-02-07 ENCOUNTER — Other Ambulatory Visit: Payer: Self-pay | Admitting: Urology

## 2021-02-11 ENCOUNTER — Other Ambulatory Visit: Payer: Self-pay

## 2021-02-11 ENCOUNTER — Encounter (HOSPITAL_BASED_OUTPATIENT_CLINIC_OR_DEPARTMENT_OTHER): Payer: Self-pay | Admitting: Urology

## 2021-02-11 NOTE — Progress Notes (Signed)
Spoke w/ via phone for pre-op interview--- Pt Lab needs dos---- Istat and EKG             Lab results------ no COVID test ------ 02-14-2021 @ 0840 Arrive at ------- 1145 on 02-17-2021 NPO after MN NO Solid Food.  Clear liquids from MN until---  10045 Med rec completed Medications to take morning of surgery ----- Symbicort inhaler, Flonase spray Diabetic medication ----- n/a Patient instructed to bring photo id and insurance card day of surgery Patient aware to have Driver (ride ) / caregiver    for 24 hours after surgery -- mother, Nataley Bahri Patient Special Instructions ----- asked to bring rescue dos Pre-Op special Istructions ----- n/a Patient verbalized understanding of instructions that were given at this phone interview. Patient denies shortness of breath, chest pain, fever, cough at this phone interview.

## 2021-02-14 ENCOUNTER — Other Ambulatory Visit (HOSPITAL_COMMUNITY)
Admission: RE | Admit: 2021-02-14 | Discharge: 2021-02-14 | Disposition: A | Discharge status: Home / Self Care | Payer: BC Managed Care – PPO | Source: Ambulatory Visit | Attending: Urology | Admitting: Urology

## 2021-02-14 DIAGNOSIS — J45909 Unspecified asthma, uncomplicated: Secondary | ICD-10-CM | POA: Diagnosis not present

## 2021-02-14 DIAGNOSIS — Z20822 Contact with and (suspected) exposure to covid-19: Secondary | ICD-10-CM | POA: Diagnosis not present

## 2021-02-14 DIAGNOSIS — Z7951 Long term (current) use of inhaled steroids: Secondary | ICD-10-CM | POA: Diagnosis not present

## 2021-02-14 DIAGNOSIS — Z8249 Family history of ischemic heart disease and other diseases of the circulatory system: Secondary | ICD-10-CM | POA: Diagnosis not present

## 2021-02-14 DIAGNOSIS — N200 Calculus of kidney: Secondary | ICD-10-CM | POA: Diagnosis not present

## 2021-02-14 DIAGNOSIS — Z8349 Family history of other endocrine, nutritional and metabolic diseases: Secondary | ICD-10-CM | POA: Diagnosis not present

## 2021-02-14 DIAGNOSIS — Z8042 Family history of malignant neoplasm of prostate: Secondary | ICD-10-CM | POA: Diagnosis not present

## 2021-02-14 DIAGNOSIS — I10 Essential (primary) hypertension: Secondary | ICD-10-CM | POA: Diagnosis not present

## 2021-02-14 DIAGNOSIS — Z791 Long term (current) use of non-steroidal anti-inflammatories (NSAID): Secondary | ICD-10-CM | POA: Diagnosis not present

## 2021-02-14 DIAGNOSIS — Z01812 Encounter for preprocedural laboratory examination: Secondary | ICD-10-CM | POA: Insufficient documentation

## 2021-02-14 DIAGNOSIS — Z87442 Personal history of urinary calculi: Secondary | ICD-10-CM | POA: Diagnosis not present

## 2021-02-14 DIAGNOSIS — Z880 Allergy status to penicillin: Secondary | ICD-10-CM | POA: Diagnosis not present

## 2021-02-14 DIAGNOSIS — Z79899 Other long term (current) drug therapy: Secondary | ICD-10-CM | POA: Diagnosis not present

## 2021-02-14 LAB — SARS CORONAVIRUS 2 (TAT 6-24 HRS): SARS Coronavirus 2: NEGATIVE

## 2021-02-17 ENCOUNTER — Ambulatory Visit (HOSPITAL_BASED_OUTPATIENT_CLINIC_OR_DEPARTMENT_OTHER)
Admission: RE | Admit: 2021-02-17 | Discharge: 2021-02-17 | Disposition: A | Discharge status: Home / Self Care | Payer: BC Managed Care – PPO | Source: Ambulatory Visit | Attending: Urology | Admitting: Urology

## 2021-02-17 ENCOUNTER — Encounter (HOSPITAL_BASED_OUTPATIENT_CLINIC_OR_DEPARTMENT_OTHER): Payer: Self-pay | Admitting: Urology

## 2021-02-17 ENCOUNTER — Ambulatory Visit (HOSPITAL_BASED_OUTPATIENT_CLINIC_OR_DEPARTMENT_OTHER): Payer: BC Managed Care – PPO | Admitting: Anesthesiology

## 2021-02-17 ENCOUNTER — Encounter (HOSPITAL_BASED_OUTPATIENT_CLINIC_OR_DEPARTMENT_OTHER)
Admission: RE | Disposition: A | Discharge status: Home / Self Care | Payer: Self-pay | Source: Ambulatory Visit | Attending: Urology

## 2021-02-17 ENCOUNTER — Other Ambulatory Visit: Payer: Self-pay

## 2021-02-17 DIAGNOSIS — Z791 Long term (current) use of non-steroidal anti-inflammatories (NSAID): Secondary | ICD-10-CM | POA: Insufficient documentation

## 2021-02-17 DIAGNOSIS — Z880 Allergy status to penicillin: Secondary | ICD-10-CM | POA: Diagnosis not present

## 2021-02-17 DIAGNOSIS — N200 Calculus of kidney: Secondary | ICD-10-CM | POA: Insufficient documentation

## 2021-02-17 DIAGNOSIS — Z87442 Personal history of urinary calculi: Secondary | ICD-10-CM | POA: Diagnosis not present

## 2021-02-17 DIAGNOSIS — Z79899 Other long term (current) drug therapy: Secondary | ICD-10-CM | POA: Insufficient documentation

## 2021-02-17 DIAGNOSIS — I10 Essential (primary) hypertension: Secondary | ICD-10-CM | POA: Insufficient documentation

## 2021-02-17 DIAGNOSIS — Z20822 Contact with and (suspected) exposure to covid-19: Secondary | ICD-10-CM | POA: Diagnosis not present

## 2021-02-17 DIAGNOSIS — Z8349 Family history of other endocrine, nutritional and metabolic diseases: Secondary | ICD-10-CM | POA: Insufficient documentation

## 2021-02-17 DIAGNOSIS — Z8249 Family history of ischemic heart disease and other diseases of the circulatory system: Secondary | ICD-10-CM | POA: Insufficient documentation

## 2021-02-17 DIAGNOSIS — Z7951 Long term (current) use of inhaled steroids: Secondary | ICD-10-CM | POA: Insufficient documentation

## 2021-02-17 DIAGNOSIS — Z8042 Family history of malignant neoplasm of prostate: Secondary | ICD-10-CM | POA: Insufficient documentation

## 2021-02-17 DIAGNOSIS — J45909 Unspecified asthma, uncomplicated: Secondary | ICD-10-CM | POA: Insufficient documentation

## 2021-02-17 HISTORY — DX: Personal history of diseases of the blood and blood-forming organs and certain disorders involving the immune mechanism: Z86.2

## 2021-02-17 HISTORY — DX: Other intervertebral disc displacement, lumbar region: M51.26

## 2021-02-17 HISTORY — DX: Personal history of other specified conditions: Z87.898

## 2021-02-17 HISTORY — DX: Leiomyoma of uterus, unspecified: D25.9

## 2021-02-17 HISTORY — PX: CYSTOSCOPY/RETROGRADE/URETEROSCOPY/STONE EXTRACTION WITH BASKET: SHX5317

## 2021-02-17 HISTORY — DX: Calculus of ureter: N20.1

## 2021-02-17 HISTORY — DX: Essential (primary) hypertension: I10

## 2021-02-17 HISTORY — DX: Unspecified asthma, uncomplicated: J45.909

## 2021-02-17 HISTORY — DX: Presence of spectacles and contact lenses: Z97.3

## 2021-02-17 HISTORY — DX: Calculus of kidney: N20.0

## 2021-02-17 LAB — POCT I-STAT, CHEM 8
BUN: 10 mg/dL (ref 6–20)
Calcium, Ion: 1.38 mmol/L (ref 1.15–1.40)
Chloride: 106 mmol/L (ref 98–111)
Creatinine, Ser: 0.7 mg/dL (ref 0.44–1.00)
Glucose, Bld: 90 mg/dL (ref 70–99)
HCT: 37 % (ref 36.0–46.0)
Hemoglobin: 12.6 g/dL (ref 12.0–15.0)
Potassium: 3.6 mmol/L (ref 3.5–5.1)
Sodium: 143 mmol/L (ref 135–145)
TCO2: 25 mmol/L (ref 22–32)

## 2021-02-17 SURGERY — CYSTOSCOPY, WITH CALCULUS REMOVAL USING BASKET
Anesthesia: General | Site: Ureter | Laterality: Bilateral

## 2021-02-17 MED ORDER — LIDOCAINE 2% (20 MG/ML) 5 ML SYRINGE
INTRAMUSCULAR | Status: AC
  Filled 2021-02-17: qty 5

## 2021-02-17 MED ORDER — ACETAMINOPHEN 500 MG PO TABS
1000.0000 mg | ORAL_TABLET | Freq: Once | ORAL | Status: AC
  Administered 2021-02-17: 1000 mg via ORAL

## 2021-02-17 MED ORDER — MIDAZOLAM HCL 2 MG/2ML IJ SOLN
INTRAMUSCULAR | Status: AC
  Filled 2021-02-17: qty 2

## 2021-02-17 MED ORDER — FENTANYL CITRATE (PF) 100 MCG/2ML IJ SOLN
INTRAMUSCULAR | Status: AC
  Filled 2021-02-17: qty 2

## 2021-02-17 MED ORDER — PROPOFOL 10 MG/ML IV BOLUS
INTRAVENOUS | Status: DC | PRN
  Administered 2021-02-17: 150 mg via INTRAVENOUS

## 2021-02-17 MED ORDER — LACTATED RINGERS IV SOLN: INTRAVENOUS | Status: DC

## 2021-02-17 MED ORDER — KETOROLAC TROMETHAMINE 30 MG/ML IJ SOLN
INTRAMUSCULAR | Status: AC
  Filled 2021-02-17: qty 1

## 2021-02-17 MED ORDER — ACETAMINOPHEN 500 MG PO TABS
ORAL_TABLET | ORAL | Status: AC
  Filled 2021-02-17: qty 2

## 2021-02-17 MED ORDER — FENTANYL CITRATE (PF) 100 MCG/2ML IJ SOLN: 25.0000 ug | INTRAMUSCULAR | Status: DC | PRN

## 2021-02-17 MED ORDER — KETOROLAC TROMETHAMINE 30 MG/ML IJ SOLN
INTRAMUSCULAR | Status: DC | PRN
  Administered 2021-02-17: 30 mg via INTRAVENOUS

## 2021-02-17 MED ORDER — GENTAMICIN SULFATE 40 MG/ML IJ SOLN
280.0000 mg | Freq: Once | INTRAVENOUS | Status: AC
  Administered 2021-02-17: 280 mg via INTRAVENOUS
  Filled 2021-02-17: qty 7

## 2021-02-17 MED ORDER — ONDANSETRON HCL 4 MG/2ML IJ SOLN
INTRAMUSCULAR | Status: DC | PRN
  Administered 2021-02-17: 4 mg via INTRAVENOUS

## 2021-02-17 MED ORDER — MIDAZOLAM HCL 2 MG/2ML IJ SOLN
INTRAMUSCULAR | Status: DC | PRN
  Administered 2021-02-17: 1 mg via INTRAVENOUS

## 2021-02-17 MED ORDER — DOCUSATE SODIUM 100 MG PO CAPS: 100.0000 mg | ORAL_CAPSULE | Freq: Every day | ORAL | 0 refills | Status: AC | PRN

## 2021-02-17 MED ORDER — ONDANSETRON HCL 4 MG/2ML IJ SOLN
INTRAMUSCULAR | Status: AC
  Filled 2021-02-17: qty 2

## 2021-02-17 MED ORDER — IOHEXOL 300 MG/ML  SOLN
INTRAMUSCULAR | Status: DC | PRN
  Administered 2021-02-17: 3 mL via URETHRAL
  Administered 2021-02-17: 20 mL via URETHRAL

## 2021-02-17 MED ORDER — DEXAMETHASONE SODIUM PHOSPHATE 10 MG/ML IJ SOLN
INTRAMUSCULAR | Status: AC
  Filled 2021-02-17: qty 1

## 2021-02-17 MED ORDER — OXYCODONE HCL 5 MG PO TABS: 5.0000 mg | ORAL_TABLET | Freq: Once | ORAL | Status: DC | PRN

## 2021-02-17 MED ORDER — TAMSULOSIN HCL 0.4 MG PO CAPS: 0.4000 mg | ORAL_CAPSULE | Freq: Every day | ORAL | 1 refills | Status: AC

## 2021-02-17 MED ORDER — EPHEDRINE SULFATE-NACL 50-0.9 MG/10ML-% IV SOSY
PREFILLED_SYRINGE | INTRAVENOUS | Status: DC | PRN
  Administered 2021-02-17: 10 mg via INTRAVENOUS

## 2021-02-17 MED ORDER — SODIUM CHLORIDE 0.9 % IR SOLN
Status: DC | PRN
  Administered 2021-02-17 (×2): 3000 mL via INTRAVESICAL

## 2021-02-17 MED ORDER — AMISULPRIDE (ANTIEMETIC) 5 MG/2ML IV SOLN: 10.0000 mg | Freq: Once | INTRAVENOUS | Status: DC | PRN

## 2021-02-17 MED ORDER — FENTANYL CITRATE (PF) 100 MCG/2ML IJ SOLN
INTRAMUSCULAR | Status: DC | PRN
  Administered 2021-02-17 (×2): 25 ug via INTRAVENOUS
  Administered 2021-02-17: 50 ug via INTRAVENOUS
  Administered 2021-02-17 (×2): 25 ug via INTRAVENOUS

## 2021-02-17 MED ORDER — OXYBUTYNIN CHLORIDE ER 10 MG PO TB24: 10.0000 mg | ORAL_TABLET | Freq: Every day | ORAL | 0 refills | Status: AC

## 2021-02-17 MED ORDER — SCOPOLAMINE 1 MG/3DAYS TD PT72
1.0000 | MEDICATED_PATCH | TRANSDERMAL | Status: DC
  Administered 2021-02-17: 1.5 mg via TRANSDERMAL

## 2021-02-17 MED ORDER — PROPOFOL 10 MG/ML IV BOLUS
INTRAVENOUS | Status: AC
  Filled 2021-02-17: qty 20

## 2021-02-17 MED ORDER — HYDROCODONE-ACETAMINOPHEN 5-325 MG PO TABS: 1.0000 | ORAL_TABLET | Freq: Four times a day (QID) | ORAL | 0 refills | Status: AC | PRN

## 2021-02-17 MED ORDER — ONDANSETRON HCL 4 MG/2ML IJ SOLN: INTRAMUSCULAR | Status: DC | PRN

## 2021-02-17 MED ORDER — PHENYLEPHRINE 40 MCG/ML (10ML) SYRINGE FOR IV PUSH (FOR BLOOD PRESSURE SUPPORT)
PREFILLED_SYRINGE | INTRAVENOUS | Status: AC
  Filled 2021-02-17: qty 10

## 2021-02-17 MED ORDER — LIDOCAINE 2% (20 MG/ML) 5 ML SYRINGE
INTRAMUSCULAR | Status: DC | PRN
  Administered 2021-02-17: 60 mg via INTRAVENOUS

## 2021-02-17 MED ORDER — OXYCODONE HCL 5 MG/5ML PO SOLN: 5.0000 mg | Freq: Once | ORAL | Status: DC | PRN

## 2021-02-17 MED ORDER — PROMETHAZINE HCL 25 MG/ML IJ SOLN: 6.2500 mg | INTRAMUSCULAR | Status: DC | PRN

## 2021-02-17 MED ORDER — SCOPOLAMINE 1 MG/3DAYS TD PT72
MEDICATED_PATCH | TRANSDERMAL | Status: AC
  Filled 2021-02-17: qty 1

## 2021-02-17 MED ORDER — PHENYLEPHRINE 40 MCG/ML (10ML) SYRINGE FOR IV PUSH (FOR BLOOD PRESSURE SUPPORT)
PREFILLED_SYRINGE | INTRAVENOUS | Status: DC | PRN
  Administered 2021-02-17 (×4): 80 ug via INTRAVENOUS

## 2021-02-17 MED ORDER — DEXAMETHASONE SODIUM PHOSPHATE 10 MG/ML IJ SOLN
INTRAMUSCULAR | Status: DC | PRN
  Administered 2021-02-17: 10 mg via INTRAVENOUS

## 2021-02-17 SURGICAL SUPPLY — 28 items
BAG DRAIN URO-CYSTO SKYTR STRL (DRAIN) ×2 IMPLANT
BAG DRN UROCATH (DRAIN) ×1
BASKET ZERO TIP NITINOL 2.4FR (BASKET) ×1 IMPLANT
BSKT STON RTRVL ZERO TP 2.4FR (BASKET) ×1
CATH SET URETHRAL DILATOR (CATHETERS) IMPLANT
CATH URET 5FR 28IN OPEN ENDED (CATHETERS) ×2 IMPLANT
CATH URET DUAL LUMEN 6-10FR 50 (CATHETERS) ×1 IMPLANT
CLOTH BEACON ORANGE TIMEOUT ST (SAFETY) ×2 IMPLANT
FIBER LASER FLEXIVA 365 (UROLOGICAL SUPPLIES) IMPLANT
GLOVE SURG ENC MOIS LTX SZ7 (GLOVE) ×2 IMPLANT
GOWN STRL REUS W/TWL LRG LVL3 (GOWN DISPOSABLE) ×2 IMPLANT
GUIDEWIRE STR DUAL SENSOR (WIRE) ×3 IMPLANT
GUIDEWIRE ZIPWRE .038 STRAIGHT (WIRE) ×2 IMPLANT
IV NS 1000ML (IV SOLUTION) ×2
IV NS 1000ML BAXH (IV SOLUTION) ×1 IMPLANT
IV NS IRRIG 3000ML ARTHROMATIC (IV SOLUTION) ×2 IMPLANT
KIT TURNOVER CYSTO (KITS) ×2 IMPLANT
MANIFOLD NEPTUNE II (INSTRUMENTS) ×2 IMPLANT
NS IRRIG 500ML POUR BTL (IV SOLUTION) ×2 IMPLANT
PACK CYSTO (CUSTOM PROCEDURE TRAY) ×2 IMPLANT
STENT POLARIS LOOP 6FR X 24 CM (STENTS) ×1 IMPLANT
STENT URET 6FRX24 CONTOUR (STENTS) ×1 IMPLANT
SYR 10ML LL (SYRINGE) ×2 IMPLANT
TRACTIP FLEXIVA PULS ID 200XHI (Laser) IMPLANT
TRACTIP FLEXIVA PULSE ID 200 (Laser)
TUBE CONNECTING 12X1/4 (SUCTIONS) ×2 IMPLANT
TUBE FEEDING 8FR 16IN STR KANG (MISCELLANEOUS) ×1 IMPLANT
TUBING UROLOGY SET (TUBING) ×2 IMPLANT

## 2021-02-17 NOTE — Transfer of Care (Signed)
Immediate Anesthesia Transfer of Care Note  Patient: Jasmine Monroe  Procedure(s) Performed: CYSTOSCOPY/RETROGRADE/URETEROSCOPY/STONE EXTRACTION WITH BASKET/ HOLMIUM LASER LITHOTRIPSY/ STENT PLACEMENT (Bilateral Ureter)  Patient Location: PACU  Anesthesia Type:General  Level of Consciousness: awake, alert  and oriented  Airway & Oxygen Therapy: Patient Spontanous Breathing and Patient connected to face mask oxygen  Post-op Assessment: Report given to RN and Post -op Vital signs reviewed and stable  Post vital signs: Reviewed and stable  Last Vitals:  Vitals Value Taken Time  BP 134/98 02/17/21 1454  Temp    Pulse 80 02/17/21 1454  Resp 18 02/17/21 1455  SpO2 100 % 02/17/21 1454  Vitals shown include unvalidated device data.  Last Pain:  Vitals:   02/17/21 1206  TempSrc: Oral  PainSc: 0-No pain         Complications: No complications documented.

## 2021-02-17 NOTE — H&P (Signed)
--------------------------   Jasmine Monroe  MRN: [id number]  DOB: 08-Jun-1971, 50 year old Female  SSN:    PRIMARY CARE:    REFERRING:  Wenda Low, MD  PROVIDER:  Rexene Alberts, M.D.  LOCATION:  [facility] [phone]     --------------------------------------------------------------------------------   CC/HPI: Jasmine Monroe is a 50 year old female seen in consultation today for bilateral urolithiasis.   She presents to the ED on 10/09/2020 where CT A/P was obtained that demonstrated a 4 x 4 x 4 mm left UVJ stone with mild left hydronephrosis. She also had a 10 x 10 x 6 mm nonobstructing stone in the left upper pole. She also had a 3 mm stone in the right lower pole. She states that she was able to pass a stone. She states that after this, she has had no further abdominal pain or flank pain. She denies fevers or chills. She denies dysuria or gross hematuria. She thinks that she also passed a stone in July 2021 which was her first episode.   KUB 12/17/2020 with evidence of left upper pole stone approximately 1 x 1 cm. No distal left ureteral stone identified. There is a large phlebolith within the left pelvis which is also noticed on scout image from a recent CT scan. Also present are multiple right pelvic phleboliths.   Patient currently denies fever, chills, sweats, nausea, vomiting, abdominal or flank pain, gross hematuria or dysuria.     ALLERGIES: Penicillin    MEDICATIONS: Albuterol Sulfate  Flonase Allergy Relief  Ibuprofen  Levocetirizine Dihydrochloride  Losartan Potassium  Symbicort     GU PSH: Laparoscopic Myomectomy - 2011       PSH Notes: Lipoma Removal - 2013   NON-GU PSH: None   GU PMH: None   NON-GU PMH: Asthma Hypertension    FAMILY HISTORY: Prostate Cancer - Father   SOCIAL HISTORY: Marital Status: Single Ethnicity: Not Hispanic Or Latino; Race: Black or African American Current Smoking Status: Patient has never smoked.   Tobacco Use  Assessment Completed: Used Tobacco in last 30 days? Has never drank.  Drinks 1 caffeinated drink per day.    REVIEW OF SYSTEMS:    GU Review Female:   Patient denies frequent urination, hard to postpone urination, burning /pain with urination, get up at night to urinate, leakage of urine, stream starts and stops, trouble starting your stream, have to strain to urinate, and being pregnant.  Gastrointestinal (Upper):   Patient denies nausea, vomiting, and indigestion/ heartburn.  Gastrointestinal (Lower):   Patient reports constipation. Patient denies diarrhea.  Constitutional:   Patient denies fever, night sweats, weight loss, and fatigue.  Skin:   Patient denies skin rash/ lesion and itching.  Eyes:   Patient denies blurred vision and double vision.  Ears/ Nose/ Throat:   Patient denies sore throat and sinus problems.  Hematologic/Lymphatic:   Patient denies swollen glands and easy bruising.  Cardiovascular:   Patient denies chest pains and leg swelling.  Respiratory:   Patient denies cough and shortness of breath.  Endocrine:   Patient denies excessive thirst.  Musculoskeletal:   Patient reports back pain. Patient denies joint pain.  Neurological:   Patient denies headaches and dizziness.  Psychologic:   Patient denies depression and anxiety.   VITAL SIGNS:      12/17/2020 01:56 PM  Weight 128 lb / 58.06 kg  Height 62 in / 157.48 cm  BP 144/93 mmHg  Pulse 69 /min  Temperature 98.0 F / 36.6 C  BMI 23.4 kg/m   MULTI-SYSTEM PHYSICAL EXAMINATION:    Constitutional: Well-nourished. No physical deformities. Normally developed. Good grooming.  Respiratory: No labored breathing, no use of accessory muscles.   Cardiovascular: Normal temperature, normal extremity pulses, no swelling, no varicosities.  Gastrointestinal: No mass, no tenderness, no rigidity, non obese abdomen. No CVA T b/l     Complexity of Data:  Source Of History:  Patient, Medical Record Summary  Urine Test Review:    Urinalysis  X-Ray Review: C.T. Abdomen/Pelvis: Reviewed Films. Reviewed Report. Discussed With Patient.     PROCEDURES:         KUB - [id number]  A single view of the abdomen is obtained. KUB 12/17/2020 with evidence of left upper pole stone approximately 1 x 1 cm. No distal left ureteral stone identified. There is a large phlebolith within the left pelvis which is also noticed on scout image from a recent CT scan. Also present are multiple right pelvic phleboliths.      . Patient confirmed No Neulasta OnPro Device.     ASSESSMENT:      ICD-10 Details  1 GU:   Renal calculus - N20.0    PLAN:           Orders Labs CULTURE, URINE  X-Rays: KUB          Schedule         Document Letter(s):  Created for Patient: Clinical Summary         Notes:    #1. Bilateral nephrolithiasis:  -CT A/P 10/09/2020 with 4 x 4 x 4 mm UVJ stone which was presumably passed. Also present with 10 x 10 x 6 mm nonobstructing stone in the left upper pole. Also present is 3 mm stone in the right lower pole.  -We discussed options for treatment including observation, ESWL or ureteroscopy with laser lithotripsy. She will consider her options and call us back if she elects to proceed with surgery.  -KUB 12/17/2020 with evidence of left upper pole stone approximately 1 x 1 cm. No distal left ureteral stone identified. There is a large phlebolith within the left pelvis which is also noticed on scout image from a recent CT scan. Also present are multiple right pelvic phleboliths.  -If elects to proceed with ESWL, would treat the left upper pole large stone.  -We will send urine for culture for preop today.  -Tentatively follow-up in 1 year with KUB   We discussed the options for management of kidney stones, including observation, ESWL, ureteroscopy with laser lithotripsy, and PCNL. The risks and benefits of each option were discussed.  For observation I described the risks which include but are not limited to silent renal  damage, life-threatening infection, need for emergent surgery, failure to pass stone, and pain.   ESWL: risks and benefits of ESWL were outlined including infection, bleeding, pain, steinstrasse, kidney injury, need for ancillary treatments, and global anesthesia risks including but not limited to CVA, MI, DVT, PE, pneumonia, and death.   Ureteroscopy: risks and benefits of ureteroscopy were outlined, including infection, bleeding, pain, temporary ureteral stent and associated stent bother, ureteral injury, ureteral stricture, need for ancillary treatments, and global anesthesia risks including but not limited to CVA, MI, DVT, PE, pneumonia, and death.   PCNL: risks and benefits of PCNL were outlined including infection, bleeding, blood transfusion, pain, pneumothorax, bowel injury, persistent urine leak, positioning injury, inability to clear stone burden, renal laceration, arterial venous fistula or malformation, need for ancillary treatments, and global  anesthesia risks including but not limited to CVA, MI, DVT, PE, pneumonia, and death.   We discussed dietary methods for stone prevention including the following: increased water intake to 2-3 liters per day, add lemon or lemon concentrate to water to increase citrate which is beneficial for stone prevention, limiting dietary sodium to less than 2000 mg per day, limiting animal protein to less than 2 servings (16 ounces/day), and limiting foods high in oxalate content (spinach, beans, chocolate, etc.).    CC: Jasmine Stabile, MD       Signed by Rexene Alberts, M.D. on 12/17/20 at 4:54 PM (EDT)  Urology Preoperative H&P   Chief Complaint: B/l renal stones  History of Present Illness: Jasmine Monroe is a 50 y.o. female with bilateral renal stones here for cysto, b/l RPG, b/l URS/LL, b/l stent placement. Denies fevers, chills.    Past Medical History:  Diagnosis Date  . Allergic rhinitis, seasonal   . Fibrocystic breast   . History of anemia    . History of syncope    per pt 02/ 2018 followed up by pcp, found to be vasovagal syncope ,  no episode since;  echo in epic 03/ 2018  ef 55-60%, mild LVH, trivial MR  . Hypertension    followed by pcp  . Left ureteral calculus   . Lumbar herniated disc   . Mild asthma    followed by pcp  . Renal calculus, bilateral   . Sciatica   . TMJ (dislocation of temporomandibular joint)    per pt left side  . Uterine fibroid   . Wears contact lenses     Past Surgical History:  Procedure Laterality Date  . LAPAROSCOPIC GELPORT ASSISTED MYOMECTOMY  2011  . LIPOMA EXCISION Left 12/2013   left axilla  . WISDOM TOOTH EXTRACTION      Allergies:  Allergies  Allergen Reactions  . Penicillins Rash    Family History  Problem Relation Age of Onset  . Hyperlipidemia Mother   . Cancer Father        prostate cancer  . Hypertension Sister     Social History:  reports that she has never smoked. She has never used smokeless tobacco. She reports that she does not drink alcohol and does not use drugs.  ROS: A complete review of systems was performed.  All systems are negative except for pertinent findings as noted.  Physical Exam:  Vital signs in last 24 hours: Temp:  [99.1 F (37.3 C)] 99.1 F (37.3 C) (05/16 1206) Pulse Rate:  [83] 83 (05/16 1206) Resp:  [15] 15 (05/16 1206) BP: (132)/(98) 132/98 (05/16 1206) SpO2:  [99 %] 99 % (05/16 1206) Weight:  [58.1 kg] 58.1 kg (05/16 1206) Constitutional:  Alert and oriented, No acute distress Cardiovascular: Regular rate and rhythm Respiratory: Normal respiratory effort, Lungs clear bilaterally GI: Abdomen is soft, nontender, nondistended, no abdominal masses GU: No CVA tenderness Lymphatic: No lymphadenopathy Neurologic: Grossly intact, no focal deficits Psychiatric: Normal mood and affect  Laboratory Data:  Recent Labs    02/17/21 1231  HGB 12.6  HCT 37.0    Recent Labs    02/17/21 1231  NA 143  K 3.6  CL 106  GLUCOSE 90   BUN 10  CREATININE 0.70     Results for orders placed or performed during the hospital encounter of 02/17/21 (from the past 24 hour(s))  I-STAT, chem 8     Status: None   Collection Time: 02/17/21 12:31 PM  Result  Value Ref Range   Sodium 143 135 - 145 mmol/L   Potassium 3.6 3.5 - 5.1 mmol/L   Chloride 106 98 - 111 mmol/L   BUN 10 6 - 20 mg/dL   Creatinine, Ser 0.70 0.44 - 1.00 mg/dL   Glucose, Bld 90 70 - 99 mg/dL   Calcium, Ion 1.38 1.15 - 1.40 mmol/L   TCO2 25 22 - 32 mmol/L   Hemoglobin 12.6 12.0 - 15.0 g/dL   HCT 37.0 36.0 - 46.0 %   Recent Results (from the past 240 hour(s))  SARS CORONAVIRUS 2 (TAT 6-24 HRS) Nasopharyngeal Nasopharyngeal Swab     Status: None   Collection Time: 02/14/21  9:12 AM   Specimen: Nasopharyngeal Swab  Result Value Ref Range Status   SARS Coronavirus 2 NEGATIVE NEGATIVE Final    Comment: (NOTE) SARS-CoV-2 target nucleic acids are NOT DETECTED.  The SARS-CoV-2 RNA is generally detectable in upper and lower respiratory specimens during the acute phase of infection. Negative results do not preclude SARS-CoV-2 infection, do not rule out co-infections with other pathogens, and should not be used as the sole basis for treatment or other patient management decisions. Negative results must be combined with clinical observations, patient history, and epidemiological information. The expected result is Negative.  Fact Sheet for Patients: SugarRoll.be  Fact Sheet for Healthcare Providers: https://www.woods-mathews.com/  This test is not yet approved or cleared by the [place] FDA and  has been authorized for detection and/or diagnosis of SARS-CoV-2 by FDA under an Emergency Use Authorization (EUA). This EUA will remain  in effect (meaning this test can be used) for the duration of the COVID-19 declaration under Se ction 564(b)(1) of the Act, 21 U.S.C. section 360bbb-3(b)(1), unless the authorization  is terminated or revoked sooner.  Performed at [facility], [place] [address] [place], [place] [address]     Renal Function: Recent Labs    02/17/21 1231  CREATININE 0.70   Estimated Creatinine Clearance: 68.8 mL/min (by C-G formula based on SCr of 0.7 mg/dL).  Radiologic Imaging: No results found.  I independently reviewed the above imaging studies.  Assessment and Plan WILLYE JAVIER is a 50 y.o. female with cysto, b/l RPG, b/l URS/LL, b/l stent placement. Denies fevers, chills.   -The risks, benefits and alternatives of cystoscopy with  cysto, b/l RPG, b/l URS/LL, b/l stent placement was discussed with the patient.  Risks include, but are not limited to: bleeding, urinary tract infection, ureteral injury, ureteral stricture disease, chronic pain, urinary symptoms, bladder injury, stent migration, the need for nephrostomy tube placement, MI, CVA, DVT, PE and the inherent risks with general anesthesia.  The patient voices understanding and wishes to proceed.   Matt R. Sarp Vernier MD 02/17/2021, 12:58 PM  [facility] Pager: [phone]): [phone]

## 2021-02-17 NOTE — Anesthesia Procedure Notes (Signed)
Procedure Name: LMA Insertion Date/Time: 02/17/2021 1:21 PM Performed by: Mechele Claude, CRNA Pre-anesthesia Checklist: Patient identified, Emergency Drugs available, Suction available and Patient being monitored Patient Re-evaluated:Patient Re-evaluated prior to induction Oxygen Delivery Method: Circle system utilized Preoxygenation: Pre-oxygenation with 100% oxygen Induction Type: IV induction Ventilation: Mask ventilation without difficulty LMA: LMA inserted LMA Size: 3.0 Number of attempts: 1 Airway Equipment and Method: Bite block Placement Confirmation: positive ETCO2 Tube secured with: Tape Dental Injury: Teeth and Oropharynx as per pre-operative assessment

## 2021-02-17 NOTE — Op Note (Signed)
Operative Note  Preoperative diagnosis:  1.  Bilateral renal stones  Postoperative diagnosis: 1.  Bilateral renal stones  Procedure(s): 1.  Cystoscopy 2. Left ureteroscopy with laser lithotripsy and basket extraction of stones 3. Right diagnostic ureteroscopy 3.  Bilateral retrograde pyelogram 4.  Bilateral ureteral stent placement 5. Fluoroscopy with intraoperative interpretation  Surgeon: Rexene Alberts, MD  Assistants:  None  Anesthesia:  General  Complications:  None  EBL:  Minimal  Specimens: 1.  Left renal stone for stone analysis (to be done at [facility])  Drains/Catheters: 1.  Left 6Fr x 24cm ureteral stent without a tether string 2. Right 6Fr x 24cm ureteral stent without a tether string  Intraoperative findings:   1. Cystoscopy demonstrated no suspicious bladder lesions.  Her bilateral ureteral orifices were slightly stenotic however effluxing clear yellow urine. 2. Scout fluoroscopy demonstrated 1 cm calcification corresponding to the stone identified in preoperative CT scan the left upper pole 3. Left retrograde pyelogram demonstrated no hydronephrosis, no suspicious filling defects.  The contrast drained promptly. 4. Right retrograde pyelogram demonstrated no hydronephrosis, no suspicious filling defects.  The contrast drained promptly. 5. Left reteroscopy demonstrated no ureteral stone.  Left pyeloscopy demonstrated 1 cm left upper pole stone which was successfully dusted and basket extracted. 6. Diagnostic right ureteroscopy with the semirigid ureteroscope demonstrated some stenosis of the distal right ureter.  I was unable to introduce the flexible digital right ureteroscope into the ureter after dilating with both a dual-lumen catheter and the inner sheath of a ureteral access sheath. 7. Successful bilateral ureteral stent placement.  Indication:  Jasmine Monroe is a 50 y.o. female with with a history of bilateral renal stones.  She presented to the ED on  10/09/2020 where CT A/P was obtained that demonstrated a 4 x 4 x 4 mm left UVJ stone with mild left hydronephrosis. She also had a 10 x 10 x 6 mm nonobstructing stone in the left upper pole. She also had a 3 mm stone in the right lower pole.  She was found to pass her left UVJ stone.  She presents today for definitive treatment of her bilateral renal stones.  After thorough discussion including all relevant risk benefits alternatives, she presents the operating today for this procedure.  Description of procedure: After informed consent was obtained from the patient, the patient was identified and taken to the operating room and placed in the supine position.  General anesthesia was administered as well as perioperative IV antibiotics.  At the beginning of the case, a time-out was performed to properly identify the patient, the surgery to be performed, and the surgical site.  Sequential compression devices were applied to the lower extremities at the beginning of the case for DVT prophylaxis.  The patient was then placed in the dorsal lithotomy supine position, prepped and draped in sterile fashion.  Preliminary scout fluoroscopy revealed that there was a 1cm calcification area at the left upper pole, which corresponds to the stone found on the preoperative CT scan. We then passed the 21-French rigid cystoscope through the urethra and into the bladder under vision without any difficulty, noting a normal urethra.  A systematic evaluation of the bladder revealed no evidence of any suspicious bladder lesions.  Ureteral orifices were in normal position.  These were found to be slightly stenotic.  Under cystoscopic and flouroscopic guidance, we cannulated the left ureteral orifice with a 0.038 sensor wire and over this, a 5-French open-ended ureteral catheter and a gentle retrograde pyelogram  was performed, revealing a normal caliber ureter without any filling defects. There was no hydronephrosis of the collecting  system. There was no suspicious filling defects. A 0.038 sensor wire was then passed up to the level of the renal pelvis and secured to the drape as a safety wire.  I then passed a dual-lumen catheter and placed a separate 0.038 zip wire and secured this to the drape as a separate wire.   Under cystoscopic and flouroscopic guidance, we cannulated the right ureteral orifice with a 0.038 sensor wire and over this, a 5-French open-ended ureteral catheter and a gentle right retrograde pyelogram was performed, revealing a normal caliber ureter without any filling defects. There was no hydronephrosis of the collecting system. There was no suspicious filling defects. A 0.038 sensor wire was then passed up to the level of the renal pelvis and secured to the drape as a safety wire.  I then passed a dual-lumen catheter and placed a separate 0.038 zip wire and secured this to the drape as a separate wire.   A semi-rigid ureteroscope was passed alongside the wire up the left distal ureter which appeared normal without evidence of stones  A semi-rigid ureteroscope was passed alongside the wire up the right distal ureter which appeared normal without evidence of stones.  The inner portion and then a a 12/14Fr ureteral access sheath was carefully advanced up the left ureter to the level of the UPJ over the sensor wire wire under fluoroscopic guidance. The flexible ureteroscope was advanced into the collecting system via the access sheath. The collecting system was inspected. The calculus was identified at the left upper pole. Using the 200 micron holmium laser fiber, the stone was dusted completely. A 2.2 Fr zero tip basket was used to remove the fragments under visual guidance. These were sent for chemical analysis. With the ureteroscope in the kidney, a gentle pyelogram was performed to delineate the calyceal system and we evaluated the calyces systematically. We encountered no further large stone burden. The rest of the  stone fragments were very tiny and these were  irrigated away gently. The calyces were re-inspected and there were no significant stone fragment residual.   We then withdrew the ureteroscope back down the ureter along with the access sheath, noting no evidence of any stones along the course of the ureter.  No significant trauma on the ureter.  There is a small clot adherent to the left ureterovesical junction.  Glidewire was left in place on the side.  I then attempted to pass the flexible digital ureteroscope over the Glidewire on the right side.  Unfortunate this would not go.  I then repassed a dual-lumen catheter and the inner sheath of a 12/14 French ureteral access sheath.  I then reattempted to pass the ureteroscope however this would not go.  I did not want to further dilate the right ureter for concern for possibly causing ureteral trauma and the fact that she only had a 3 mm stone on her right side.  Thus, I elected to place a right ureteral stent to allow for passive dilation and further discuss with the patient whether or not she would want to pursue second look ureteroscopy.  Once the ureteroscope was removed, the left glidewire was backloaded through the rigid cystoscope, which was then advanced down the urethra and into the bladder. We then used the Glidewire under direct vision through the rigid cystoscope and under fluoroscopic guidance and passed up a 6-French, 24 cm prolaris ureteral stent  up ureter, making sure that the proximal end coiled well and the distal end was seen in the bladder respectively.  I did not leave a tether string.  The right Glidewire was then backloaded through the rigid cystoscope and was used to pass have a right 6 [place] by 24 cm double-pigtail ureteral stent making sure the proximal coil was in the right upper pole and bladder respectively.  I did not leave a tether string.  The cystoscope was then advanced back into the bladder under vision.  We were able to  see the distal stents nicely positioned in the bladder.  Urine was seen emanating from the holes of the stents.  The bladder was then emptied with irrigation solution.  The cystoscope was then removed.    The patient tolerated the procedure well and there was no complication. Patient was awoken from anesthesia and taken to the recovery room in stable condition. I was present and scrubbed for the entirety of the case.  Plan:  Patient will be discharged home.  Follow up with me in 7 to 10 days for stent removal in the office.  I will plan to discuss with her whether or not she would want to pursue a second look right ureteroscopy to treat her 3 mm right lower pole stone.  Matt R. [facility]  Pager: [phone]

## 2021-02-17 NOTE — Anesthesia Preprocedure Evaluation (Addendum)
Anesthesia Evaluation  Patient identified by MRN, date of birth, ID band Patient awake    Reviewed: Allergy & Precautions, NPO status , Patient's Chart, lab work & pertinent test results  Airway Mallampati: II  TM Distance: >3 FB Neck ROM: Full    Dental no notable dental hx.    Pulmonary asthma ,    Pulmonary exam normal breath sounds clear to auscultation       Cardiovascular Exercise Tolerance: Good hypertension, Normal cardiovascular exam Rhythm:Regular Rate:Normal     Neuro/Psych  Neuromuscular disease (sciatica)    GI/Hepatic negative GI ROS, Neg liver ROS,   Endo/Other  negative endocrine ROS  Renal/GU Renal disease (kidney stones)  negative genitourinary   Musculoskeletal negative musculoskeletal ROS (+)   Abdominal   Peds negative pediatric ROS (+)  Hematology negative hematology ROS (+)   Anesthesia Other Findings TMJ (left side)  Reproductive/Obstetrics negative OB ROS                            Anesthesia Physical Anesthesia Plan  ASA: II  Anesthesia Plan: General   Post-op Pain Management:    Induction: Intravenous  PONV Risk Score and Plan: 3 and Scopolamine patch - Pre-op, Treatment may vary due to age or medical condition, Ondansetron and Midazolam  Airway Management Planned: LMA  Additional Equipment: None  Intra-op Plan:   Post-operative Plan: Extubation in OR  Informed Consent:   Plan Discussed with:   Anesthesia Plan Comments:         Anesthesia Quick Evaluation

## 2021-02-17 NOTE — Discharge Instructions (Signed)
CYSTOSCOPY HOME CARE INSTRUCTIONS  Activity: Rest for the remainder of the day.  Do not drive or operate equipment today.  You may resume normal activities in one to two days as instructed by your physician.   Meals: Drink plenty of liquids and eat light foods such as gelatin or soup this evening.  You may return to a normal meal plan tomorrow.  Return to Work: You may return to work in one to two days or as instructed by your physician.  Special Instructions / Symptoms: Call your physician if any of these symptoms occur:   -persistent or heavy bleeding  -bleeding which continues after first few urination  -large blood clots that are difficult to pass  -urine stream diminishes or stops completely  -fever equal to or higher than 101 degrees Farenheit.  -cloudy urine with a strong, foul odor  -severe pain  Females should always wipe from front to back after elimination.  You may feel some burning pain when you urinate.  This should disappear with time.  Applying moist heat to the lower abdomen or a hot tub bath may help relieve the pain.    Post Anesthesia Home Care Instructions  Activity: Get plenty of rest for the remainder of the day. A responsible individual must stay with you for 24 hours following the procedure.  For the next 24 hours, DO NOT: -Drive a car -Paediatric nurse -Drink alcoholic beverages -Take any medication unless instructed by your physician -Make any legal decisions or sign important papers.  Meals: Start with liquid foods such as gelatin or soup. Progress to regular foods as tolerated. Avoid greasy, spicy, heavy foods. If nausea and/or vomiting occur, drink only clear liquids until the nausea and/or vomiting subsides. Call your physician if vomiting continues.  Special Instructions/Symptoms: Your throat may feel dry or sore from the anesthesia or the breathing tube placed in your throat during surgery. If this causes discomfort, gargle with warm salt  water. The discomfort should disappear within 24 hours.  If you had a scopolamine patch placed behind your ear for the management of post- operative nausea and/or vomiting:  1. The medication in the patch is effective for 72 hours, after which it should be removed.  Wrap patch in a tissue and discard in the trash. Wash hands thoroughly with soap and water. 2. You may remove the patch earlier than 72 hours if you experience unpleasant side effects which may include dry mouth, dizziness or visual disturbances. 3. Avoid touching the patch. Wash your hands with soap and water after contact with the patch.    **No Tylenol/acetaminophen until after 6:15pm today if needed for pain.  **No Advil/Motrin/Aleve/ibuprofen until after 8:45pm today if needed for pain.  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  These are temporary and will be removed.  Follow-up in the office next week for removal.

## 2021-02-19 ENCOUNTER — Encounter (HOSPITAL_BASED_OUTPATIENT_CLINIC_OR_DEPARTMENT_OTHER): Payer: Self-pay | Admitting: Urology

## 2021-02-19 NOTE — Anesthesia Postprocedure Evaluation (Signed)
Anesthesia Post Note  Patient: Jasmine Monroe  Procedure(s) Performed: CYSTOSCOPY/RETROGRADE/URETEROSCOPY/STONE EXTRACTION WITH BASKET/ HOLMIUM LASER LITHOTRIPSY/ STENT PLACEMENT (Bilateral Ureter)     Patient location during evaluation: PACU Anesthesia Type: General Level of consciousness: awake and alert Pain management: pain level controlled Vital Signs Assessment: post-procedure vital signs reviewed and stable Respiratory status: spontaneous breathing, nonlabored ventilation and respiratory function stable Cardiovascular status: blood pressure returned to baseline and stable Postop Assessment: no apparent nausea or vomiting Anesthetic complications: no   No complications documented.  Last Vitals:  Vitals:   02/17/21 1515 02/17/21 1545  BP: (!) 127/94 (!) 135/94  Pulse: 75 64  Resp: (!) 22 18  Temp: 36.4 C (!) 36.4 C  SpO2: 95% 98%    Last Pain:  Vitals:   02/17/21 1545  TempSrc:   PainSc: 0-No pain   Pain Goal:                   Merlinda Frederick

## 2021-06-12 ENCOUNTER — Other Ambulatory Visit: Payer: Self-pay | Admitting: Internal Medicine

## 2021-06-12 ENCOUNTER — Ambulatory Visit
Admission: RE | Admit: 2021-06-12 | Discharge: 2021-06-12 | Disposition: A | Discharge status: Home / Self Care | Payer: BC Managed Care – PPO | Source: Ambulatory Visit | Attending: Internal Medicine | Admitting: Internal Medicine

## 2021-06-12 DIAGNOSIS — R509 Fever, unspecified: Secondary | ICD-10-CM

## 2021-07-18 ENCOUNTER — Other Ambulatory Visit: Payer: Self-pay | Admitting: Internal Medicine

## 2021-07-18 DIAGNOSIS — Z1231 Encounter for screening mammogram for malignant neoplasm of breast: Secondary | ICD-10-CM

## 2021-08-06 ENCOUNTER — Other Ambulatory Visit: Payer: Self-pay | Admitting: Rehabilitation

## 2021-08-06 DIAGNOSIS — M5186 Other intervertebral disc disorders, lumbar region: Secondary | ICD-10-CM

## 2021-08-06 DIAGNOSIS — M4726 Other spondylosis with radiculopathy, lumbar region: Secondary | ICD-10-CM

## 2021-08-22 ENCOUNTER — Ambulatory Visit
Admission: RE | Admit: 2021-08-22 | Discharge: 2021-08-22 | Disposition: A | Discharge status: Home / Self Care | Payer: BC Managed Care – PPO | Source: Ambulatory Visit | Attending: Rehabilitation | Admitting: Rehabilitation

## 2021-08-22 ENCOUNTER — Other Ambulatory Visit: Payer: Self-pay

## 2021-08-22 DIAGNOSIS — M4726 Other spondylosis with radiculopathy, lumbar region: Secondary | ICD-10-CM

## 2021-08-22 DIAGNOSIS — M5186 Other intervertebral disc disorders, lumbar region: Secondary | ICD-10-CM

## 2021-08-26 ENCOUNTER — Other Ambulatory Visit: Payer: BC Managed Care – PPO

## 2021-09-02 ENCOUNTER — Other Ambulatory Visit: Payer: Self-pay

## 2021-09-02 ENCOUNTER — Ambulatory Visit
Admission: RE | Admit: 2021-09-02 | Discharge: 2021-09-02 | Disposition: A | Discharge status: Home / Self Care | Payer: BC Managed Care – PPO | Source: Ambulatory Visit | Attending: Internal Medicine | Admitting: Internal Medicine

## 2021-09-02 DIAGNOSIS — Z1231 Encounter for screening mammogram for malignant neoplasm of breast: Secondary | ICD-10-CM

## 2022-08-03 ENCOUNTER — Other Ambulatory Visit: Payer: Self-pay | Admitting: Internal Medicine

## 2022-08-03 DIAGNOSIS — Z1231 Encounter for screening mammogram for malignant neoplasm of breast: Secondary | ICD-10-CM

## 2022-09-09 ENCOUNTER — Other Ambulatory Visit: Payer: Self-pay | Admitting: Obstetrics and Gynecology

## 2022-09-09 ENCOUNTER — Other Ambulatory Visit (HOSPITAL_COMMUNITY)
Admission: RE | Admit: 2022-09-09 | Discharge: 2022-09-09 | Disposition: A | Discharge status: Home / Self Care | Payer: BC Managed Care – PPO | Source: Ambulatory Visit | Attending: Obstetrics and Gynecology | Admitting: Obstetrics and Gynecology

## 2022-09-09 DIAGNOSIS — Z01419 Encounter for gynecological examination (general) (routine) without abnormal findings: Secondary | ICD-10-CM | POA: Diagnosis present

## 2022-09-11 LAB — CYTOLOGY - PAP
Comment: NEGATIVE
Diagnosis: NEGATIVE
High risk HPV: NEGATIVE

## 2022-10-02 ENCOUNTER — Ambulatory Visit: Payer: BC Managed Care – PPO

## 2022-11-11 ENCOUNTER — Ambulatory Visit
Admission: RE | Admit: 2022-11-11 | Discharge: 2022-11-11 | Disposition: A | Discharge status: Home / Self Care | Payer: BC Managed Care – PPO | Source: Ambulatory Visit | Attending: Internal Medicine | Admitting: Internal Medicine

## 2022-11-11 DIAGNOSIS — Z1231 Encounter for screening mammogram for malignant neoplasm of breast: Secondary | ICD-10-CM

## 2023-09-24 ENCOUNTER — Other Ambulatory Visit: Payer: Self-pay | Admitting: Internal Medicine

## 2023-09-24 DIAGNOSIS — Z1231 Encounter for screening mammogram for malignant neoplasm of breast: Secondary | ICD-10-CM

## 2023-11-18 ENCOUNTER — Ambulatory Visit
Admission: RE | Admit: 2023-11-18 | Discharge: 2023-11-18 | Disposition: A | Discharge status: Home / Self Care | Payer: 59 | Source: Ambulatory Visit | Attending: Internal Medicine | Admitting: Internal Medicine

## 2023-11-18 DIAGNOSIS — Z1231 Encounter for screening mammogram for malignant neoplasm of breast: Secondary | ICD-10-CM

## 2024-08-16 ENCOUNTER — Other Ambulatory Visit: Payer: Self-pay | Admitting: Internal Medicine

## 2024-08-16 DIAGNOSIS — Z1231 Encounter for screening mammogram for malignant neoplasm of breast: Secondary | ICD-10-CM

## 2024-11-20 ENCOUNTER — Ambulatory Visit
